# Patient Record
Sex: Male | Born: 1950
Health system: Southern US, Community
[De-identification: ages and names within clinical notes are randomized; demographics above are authoritative.]

## PROBLEM LIST (undated history)

## (undated) DIAGNOSIS — I10 Essential (primary) hypertension: Secondary | ICD-10-CM

## (undated) DIAGNOSIS — K056 Periodontal disease, unspecified: Secondary | ICD-10-CM

## (undated) DIAGNOSIS — E119 Type 2 diabetes mellitus without complications: Secondary | ICD-10-CM

## (undated) DIAGNOSIS — E785 Hyperlipidemia, unspecified: Secondary | ICD-10-CM

## (undated) DIAGNOSIS — E669 Obesity, unspecified: Secondary | ICD-10-CM

## (undated) DIAGNOSIS — R9431 Abnormal electrocardiogram [ECG] [EKG]: Secondary | ICD-10-CM

## (undated) DIAGNOSIS — G4733 Obstructive sleep apnea (adult) (pediatric): Secondary | ICD-10-CM

## (undated) HISTORY — DX: Type 2 diabetes mellitus without complications: E11.9

## (undated) HISTORY — DX: Periodontal disease, unspecified: K05.6

## (undated) HISTORY — PX: WISDOM TOOTH EXTRACTION: SHX21

## (undated) HISTORY — DX: Hyperlipidemia, unspecified: E78.5

## (undated) HISTORY — DX: Obstructive sleep apnea (adult) (pediatric): G47.33

## (undated) HISTORY — DX: Essential (primary) hypertension: I10

## (undated) HISTORY — DX: Obesity, unspecified: E66.9

## (undated) HISTORY — DX: Abnormal electrocardiogram (ECG) (EKG): R94.31

---

## 2000-07-30 ENCOUNTER — Ambulatory Visit (HOSPITAL_COMMUNITY): Admission: RE | Admit: 2000-07-30 | Discharge: 2000-07-30 | Payer: Self-pay | Admitting: *Deleted

## 2000-07-30 LAB — HM COLONOSCOPY

## 2001-02-11 ENCOUNTER — Ambulatory Visit (HOSPITAL_BASED_OUTPATIENT_CLINIC_OR_DEPARTMENT_OTHER): Admission: RE | Admit: 2001-02-11 | Discharge: 2001-02-11 | Payer: Self-pay | Admitting: Internal Medicine

## 2001-04-03 ENCOUNTER — Ambulatory Visit (HOSPITAL_BASED_OUTPATIENT_CLINIC_OR_DEPARTMENT_OTHER): Admission: RE | Admit: 2001-04-03 | Discharge: 2001-04-03 | Payer: Self-pay | Admitting: Internal Medicine

## 2002-03-14 ENCOUNTER — Inpatient Hospital Stay (HOSPITAL_COMMUNITY): Admission: AD | Admit: 2002-03-14 | Discharge: 2002-03-21 | Payer: Self-pay | Admitting: Internal Medicine

## 2002-03-17 ENCOUNTER — Encounter (INDEPENDENT_AMBULATORY_CARE_PROVIDER_SITE_OTHER): Payer: Self-pay | Admitting: Cardiology

## 2002-03-27 ENCOUNTER — Encounter: Admission: RE | Admit: 2002-03-27 | Discharge: 2002-06-25 | Payer: Self-pay | Admitting: Internal Medicine

## 2004-09-17 ENCOUNTER — Encounter: Admission: RE | Admit: 2004-09-17 | Discharge: 2004-09-17 | Payer: Self-pay | Admitting: Internal Medicine

## 2006-02-23 ENCOUNTER — Ambulatory Visit: Payer: Self-pay | Admitting: Internal Medicine

## 2007-02-18 DIAGNOSIS — E119 Type 2 diabetes mellitus without complications: Secondary | ICD-10-CM | POA: Insufficient documentation

## 2007-02-18 DIAGNOSIS — G4733 Obstructive sleep apnea (adult) (pediatric): Secondary | ICD-10-CM

## 2007-02-18 DIAGNOSIS — J310 Chronic rhinitis: Secondary | ICD-10-CM

## 2007-02-21 ENCOUNTER — Ambulatory Visit: Payer: Self-pay | Admitting: Internal Medicine

## 2008-06-19 ENCOUNTER — Ambulatory Visit: Payer: Self-pay | Admitting: Internal Medicine

## 2009-06-19 ENCOUNTER — Ambulatory Visit: Payer: Self-pay | Admitting: Internal Medicine

## 2009-07-08 ENCOUNTER — Telehealth (INDEPENDENT_AMBULATORY_CARE_PROVIDER_SITE_OTHER): Payer: Self-pay | Admitting: *Deleted

## 2009-07-26 ENCOUNTER — Telehealth (INDEPENDENT_AMBULATORY_CARE_PROVIDER_SITE_OTHER): Payer: Self-pay | Admitting: *Deleted

## 2009-11-29 ENCOUNTER — Encounter: Admission: RE | Admit: 2009-11-29 | Discharge: 2009-11-29 | Payer: Self-pay | Admitting: Ophthalmology

## 2010-02-25 NOTE — Progress Notes (Signed)
  Phone Note Other Incoming   Request: Send information Summary of Call: Request for records received from [employer]. Request forwarded to [facility].

## 2010-02-25 NOTE — Assessment & Plan Note (Signed)
Summary: 12 months/apc   Primary Provider/Referring Provider:  Willey Blade  CC:  Follow up visit-sleep; doing great.[name]  History of Present Illness: 03/15/22.09- OSA Allergic rhinitis 1 year follow-up .  continues cpap 13 cwp [facility], full face mask, heated humidifier This unit is comfortable after refit last year. Feels rested. No complaints from wife. Discussed weight gain.  06/19/08- OSA, Allergic rhinitis Continues fully compliant with cpap all night every night and reports doing well. Wife not reporting snoring through machine and he feels well rested. Full face mask.and humidifer. Allergy- not too bad this Spring  Jun 19, 2009- OSA, Allergic rhinitis Has never had pneumovax- he questioned. Not at risk so he will wait til age 11. OSA- Remains compliant every night with CPAP at 13. Denies snore through or fatigue. Allergy - No significant issue this year. He didn't need antihistamine during peak Spring pollen..     Current Medications (verified): 1)  Bayer Aspirin Ec Low Dose 81 Mg  Tbec (Aspirin) .... Take 1 Tablet By Mouth Once A Day 2)  Centrum Silver   Tabs (Multiple Vitamins-Minerals) .... Take 1 Tablet By Mouth Once A Day 3)  Juice Plus Fibre   Liqd (Nutritional Supplements) .... Four Times A Day 4)  Amaryl 2 Mg  Tabs (Glimepiride) .... Take 1 By Mouth Once Daily 5)  Avandamet 2-500 Mg  Tabs (Rosiglitazone-Metformin) .... Take 2 By Mouth Once Daily 6)  Lisinopril 20 Mg Tabs (Lisinopril) .... Take 1.5 By Mouth Once Daily 7)  Simvastatin 40 Mg  Tabs (Simvastatin) .... Take 1 By Mouth Once Daily 8)  Diltiazem Hcl Cr 240 Mg Xr24h-Cap (Diltiazem Hcl) .... Take 1 Capsule Daily By Mouth 9)  Lantus 100 Unit/ml  Soln (Insulin Glargine) .... 30 Units At Bedtime 10)  Humalog 100 Unit/ml  Soln (Insulin Lispro (Human)) .... Sliding Scale 11)  Hydrochlorothiazide 25 Mg Tabs (Hydrochlorothiazide) .... Take 1/2 Tablet By Mouth Daily 12)  Cpap 13  [facility]  Allergies (verified): No Known Drug Allergies  Past History:  Past Surgical History: Last updated: 06/19/2008 wisdom teeth  Family History: Last updated: 2007-03-16 Parents both died MI Diabetes  Social History: Last updated: 06/19/2008 Patient states former smoker.  Retired IT trainer at [employer]  Risk Factors: Smoking Status: quit (03-16-2007)  Past Medical History: Diabetes, Type 2 Sleep Apnea Allergic rhinitis  Review of Systems      See HPI  The patient denies shortness of breath with activity, shortness of breath at rest, productive cough, non-productive cough, coughing up blood, chest pain, irregular heartbeats, acid heartburn, indigestion, loss of appetite, weight change, abdominal pain, difficulty swallowing, sore throat, tooth/dental problems, headaches, nasal congestion/difficulty breathing through nose, and sneezing.    Vital Signs:  Patient profile:   60 year old male Height:      66 inches Weight:      208 pounds BMI:     33.69 O2 Sat:      98 % on Room air Pulse rate:   69 / minute BP sitting:   130 / 84  (left arm) Cuff size:   large  Vitals Entered By: Reynaldo Minium CMA (Jun 19, 2009 10:21 AM)  O2 Flow:  Room air  Physical Exam  Additional Exam:  General: A/Ox3; pleasant and cooperative, NAD, SKIN: no rash, lesions NODES: no lymphadenopathy HEENT: [place]/AT, EOM- WNL, Conjuctivae- clear, PERRLA, TM-WNL, Nose- turbinate edmea, able to breath through nose, Throat- clear and wnl, Mallampti IV NECK: Supple w/ fair ROM, JVD-  none, normal carotid impulses w/o bruits Thyroid-  CHEST: Clear to P&A HEART: RRR, no m/g/r heard ABDOMEN: moderately heavy ZOX:WRUE, nl pulses, no edema  NEURO: Grossly intact to observation      Impression & Recommendations:  Problem # 1:  OBSTRUCTIVE SLEEP APNEA (ICD-327.23)  Good compliance and control with CPAP. Discussed in context of his diabetes an other cardiac risk factors.  Problem # 2:   RHINITIS (ICD-472.0) Assessment: Improved  Insignificant issue this year  Medications Added to Medication List This Visit: 1)  Juice Plus Fibre Liqd (Nutritional supplements) .... Four times a day 2)  Lisinopril 20 Mg Tabs (Lisinopril) .... Take 1.5 by mouth once daily  Other Orders: Est. Patient Level III (45409)  Patient Instructions: 1)  Please schedule a follow-up appointment in 1 year. 2)  Continue CPAP at 13 3)  Copy sent to: Dr August Saucer

## 2010-02-25 NOTE — Progress Notes (Signed)
  Phone Note Other Incoming   Request: Send information Summary of Call: Request for records received from [employer]. Request forwarded to [facility].     

## 2010-02-25 NOTE — Progress Notes (Signed)
  Phone Note Other Incoming   Request: Send information Summary of Call: Request for records received from [employer]. Request forwarded to [facility].     

## 2010-06-13 ENCOUNTER — Encounter: Payer: Self-pay | Admitting: Internal Medicine

## 2010-06-13 NOTE — Discharge Summary (Signed)
NAMEBAYRON, DALTO NO.:  [id number]   MEDICAL RECORD NO.:  [id number]                   PATIENT TYPE:  INP   LOCATION:  0359                                 FACILITY:  [facility]   PHYSICIAN:  Eric L. August Saucer, M.D.                  DATE OF BIRTH:  Aug 28, 1950   DATE OF ADMISSION:  03/14/2002  DATE OF DISCHARGE:  03/21/2002                                 DISCHARGE SUMMARY   FINAL DIAGNOSES:  1. Diabetes mellitus with hyperosmolarity, type 2, non-insulin-dependent,     uncontrolled, 250.22.  2. Hypovolemia, severe, 276.5.  3. Rhabdomyolysis, 728.88k.  4. Thrombocytopenia, 287.5.  5. Hypertension, 401.9.  6. Renal ureteral disease, 593.9.   OPERATIONS AND PROCEDURES:  None.   HISTORY OF PRESENT ILLNESS:  This was the first recent [facility]  admissions for this 60 year old, married, black male, who was brought to the  office by his wife, complaining of severe weakness.  The wife reports the  patient had not been feeling well for approximately two weeks.  It was noted  that he had decreased appetite with progressive weakness.  He was also  having increased thirst as well as drinking plenty of liquids.  There had  been some nausea without vomiting.  The patient denied chest pain or  abdominal pain.  The patient, on the day of admission, became extremely weak  and was brought to the office for further evaluation.  He was noted to be  extremely dehydrated with hypotension.  The patient was subsequently  referred to [facility] for admission.  His blood sugar was checked  in the office and was noted to be high by Glucometer reading.   PAST MEDICAL HISTORY AND PHYSICAL EXAMINATION:  Per admission H&P.   HOSPITAL COURSE:  The patient was admitted to the intensive care unit for  evaluation of uncontrolled diabetes mellitus with a hyperosmolar state.  Notably, at the time of presentation, he had a glucose of 1455.  Sodium was  147,  potassium 6.2, chloride 98, BUN elevated at 92, creatinine 5.4.  He was  started on IV fluids with normal saline for rapid rehydration.  He was  started on sliding-scale insulin regimen as well.  He had some electrolyte  abnormalities including potassium being elevated as well as BUN and  creatinine.  This was monitored closely over the subsequent 24 hours.  Notably, he had trace ketones at most.  His picture was most consistent with  a hyperosmolar state.   The patient, over the initial 24 hours, had a gradual but steady drop in his  blood sugars.  As this did improve, his mental status cleared considerably  as well.  With repeated hydration, his renal function improved as well,  dropping to a creatinine of 1.4 and BUN of 23 by 03/16/02.  Notably, he was  found to have elevated CPK's  with relatively small MB and negative troponin.  This was consistent with a rhabdomyolysis picture which most likely  aggravated his kidney function as well.  The patient did experience  intermittent electrolyte abnormalities which required adjustments on an  ongoing basis.  Despite this, however, with repeated hydration and  stabilization, he made considerable progress.   As the patient became more lucid, he was seen by the diabetic coordinator  regarding of diabetes and medication change as well as the aspect of him  checking his own sugars.  The patient embraced this idea more consistently  as he became more aware.   With the rhabdomyolysis, the patient was noted to significant lower  extremity weakness.  He gradually was more ambulatory as he improved.  He  did have a lot of lower extremity edema secondary to lower and chronic  pressures.  It was felt that further improvement would be made as an  outpatient.  By 03/21/02, the patient was feeling considerably better.  He  was still notably weak with ambulation.  No orthostasis could be  demonstrated.  Blood sugars were 150 at most.  Notably, his  cholesterol was  noted to be elevated at 305.  His hemoglobin A1c was also elevated at 16.2.  These will be reviewed at a later date as he stabilizes.   The patient subsequently discharged home much improved.   MEDICATIONS AT TIME OF DISCHARGE:  1. Tiazac 120 mg p.o. daily.  2. Avandamet 2/500, 1 b.i.d.  3. Amaryl 2 mg q.a.m.  4. Lantus 28 units subcu q.h.s.  5. Aspirin 81 mg daily.  6. Multivitamins 1 daily.  7. Sliding-scale with Humalog before meals only.  For CBG's greater than     350, 18 units; 301-350, 15 units; 251-300, 12 units; 201-250, 9 units;     151-200, 6 units; 101-150, 3 units.   ACTIVITY:  As tolerated with progression of physical activity over the next  few weeks.   DIET:  He will be maintained on 4 g sodium 1800 calorie ADA diet.   FOLLOW UP:  He will have a [facility]-up [facility].                                               Eric L. August Saucer, M.D.    ELD/MEDQ  D:  04/26/2002  T:  04/26/2002  Job:  [id number]

## 2010-06-13 NOTE — H&P (Signed)
[name], [name] NO.:  [id number]   MEDICAL RECORD NO.:  [id number]                   PATIENT TYPE:  INP   LOCATION:  0164                                 FACILITY:  [facility]   PHYSICIAN:  [name] [name], M.D.                  DATE OF BIRTH:  1950-02-06   DATE OF ADMISSION:  03/14/2002  DATE OF DISCHARGE:                                HISTORY & PHYSICAL   CHIEF COMPLAINT:  Progressive weakness, dehydration, uncontrolled diabetes  mellitus.   HISTORY OF PRESENT ILLNESS:  The first recent [facility] admission  for this 60 year old married black male who was brought to the office by his  wife complaining of severe weakness. The wife reports the patient had been  feeling well for approximately two weeks. It was noted that he had decreased  appetite with progressive weakness. He also is having increasing thirst as  well as drinking plenty of liquids. There had been some nausea without  vomiting. He denied chest or abdominal pain. The patient today became  extremely weak and he was brought to the office for further evaluation. He  was noted to be extremely dehydrated with blood pressure of 80/30. The  patient subsequently referred to the hospital for admission. Note his blood  sugar was checked and it was found to be high by Glucometer reading.   History significant for him having previously diet controlled diabetes  mellitus. When last seen on January 22, he was doing well. The patient  notably did not obtain his CMET and A1C at that time. He denied any other  dietary discretions. He recently had a viral illness approximately six weeks  ago which he recovered.   The patient does not smoke or drink.   SOCIAL HISTORY:  The patient is married, three children. He is an  Airline pilot.   CURRENT MEDICATIONS:  1. Tiazac 120 mg p.o. q.d.  2. Aspirin 81 mg q.d.  3. Nasacort AQ 1-2 puffs q.d. p.r.n.  4. Juice plus nutritional supplement p.r.n.   PAST SURGICAL HISTORY:  No previous surgeries.   REVIEW OF SYMPTOMS:  Otherwise notable for him having sleep apnea. He does  use a CPAP machine as well.   PHYSICAL EXAMINATION:  GENERAL:  He is a weak appearing black male  sluggishly responsive.  VITAL SIGNS:  Height 5 feet 8 inches, weight 152 pounds (usual weight 194  pounds). Blood pressure initially 80/30 in the office, on repeat 109/54.  Temperature 98.3, heart rate 106. O2 sat was 92% on room air.  HEENT:  Head normocephalic, atraumatic without bruits. Extraocular muscles  are intact. There is no sinus tenderness. TM's with decreased light reflex.  Throat membranes extremely dry.  NECK:  No enlarged thyroid, no posterior cervical nodes.  LUNGS:  Clear without wheezes or rales. No E to A changes.  CARDIOVASCULAR:  Rapid rhythm, normal S1,  S2, no S3.  ABDOMEN:  Bowel sounds present, no tenderness appreciated.  EXTREMITIES:  Negative Homans, no edema. Markedly decreased skin turgor  noted.  NEUROLOGIC:  The patient is lethargic, arousable with stimulation. Slow to  answer questions. Would follow basic commands.   LABORATORY DATA:  CBC revealed WBC of 12,600, hemoglobin was 16.1,  hematocrit of 51.5, 328,000 platelets. Chemistry, sodium 147, potassium 6.2,  chloride 98 to 220, BUN 92, creatinine of 5.4, glucose of 1455. Liver  functions, SGOT 15, SGPT 19, alkaline phosphatase 94, total bilirubin 1.6.  Calcium 8.5. Urinalysis negative protein, specific gravity of 1.030, trace  ketones at  most. Small urine hemoglobin.   EKG sinus rhythm with a rate of 108. Normal axis. No acute changes  appreciated.   IMPRESSION:  1. Uncontrolled diabetes mellitus with decreased mental status secondary to     hyperosmolar state.  2. Severe dehydration.  3. Weight loss secondary to uncontrolled diabetes mellitus.  4. Rule out occult infection.  5. Renal insufficiency rule out secondary prerenal component due to     dehydration.  6. Sleep apnea  controlled by CPAP.  7. Recent respiratory infection.   PLAN:  The patient has been admitted to the intensive care unit. Will be  placed on IV fluids with initial rapid rehydration with normal saline. Will  place on glucomander for slow decrease of his blood sugars to avoid cerebral  edema. Will continue monitoring his electrolyte picture closely over the  next several hours with adjustments of IV fluids as necessary. Will plan on  CT scan of the head in a.m. for reevaluation of mental status. Further  therapy pending response to the above.                                               [name] [name], M.D.    ELD/MEDQ  D:  03/14/2002  T:  03/15/2002  Job:  [id number]

## 2010-06-13 NOTE — Assessment & Plan Note (Signed)
[name] [name]                             PULMONARY OFFICE NOTE   NAME:[name] [name]                        MRN:          [id number]  DATE:02/23/2006                            DOB:          Jun 22, 1950    PULMONARY/SLEEP MEDICINE FOLLOWUP   PROBLEM:  1. Obstructive sleep apnea.  2. Rhinitis.  3. Diabetes.   HISTORY:  This is a 60 year old gentleman last seen at [facility] in 2004 for follow up of sleep apnea.  He is an insulin  dependant diabetic, also with history of allergic rhinitis.  Never  smoked.  He comes now to establish for continuity, admitting that his  CPAP machine broke 6 months ago.  It took pressure from Dr. August [name] and  complaints from his wife that he was snoring loudly to motivate him to  come in now.  He has recognized increased tiredness.  His CPAP had been  set at 13, which seemed to be a comfortable pressure.   MEDICATIONS:  1. Amaryl.  2. Avandamet.  3. Lisinopril.  4. Baby aspirin.   ALLERGIES:  No medication allergy.   OBJECTIVE:  Weight 208 pounds, BP 124/78, pulse regular 76, room air  saturation 97%.  He is obese, pleasant, apparently comfortable and quite alert.  His nasal airway is not congested, palate length is long with spacing  4/4, small mandible.  No thyromegaly, strider or neck vein distension.  Voice quality is normal.  CHEST:  Clear to P and A.  Heart sounds regular without murmur or gallop.  No peripheral edema, no tremor.   IMPRESSION:  Severe obstructive sleep apnea (56 per hour on February 11, 2001).   PLAN:  We are getting replacement CPAP to hold pressure at 13 CWP.  I  have discussed his responsibility to drive safely, to lose weight and to  maintain good sleep hygiene.  As long as he is comfortable, we will  schedule return in 1 year, but earlier p.r.n.     [name] D. Maple Hudson, MD, [name] [name], FACP  Electronically Signed    CDY/MedQ  DD: 02/23/2006  DT: 02/24/2006  Job #:  [id number]   cc:   [name] [name], M.D.

## 2010-06-20 ENCOUNTER — Ambulatory Visit: Payer: Self-pay | Admitting: Internal Medicine

## 2010-07-09 ENCOUNTER — Ambulatory Visit (INDEPENDENT_AMBULATORY_CARE_PROVIDER_SITE_OTHER): Payer: BC Managed Care – PPO | Admitting: Internal Medicine

## 2010-07-09 ENCOUNTER — Encounter: Payer: Self-pay | Admitting: Internal Medicine

## 2010-07-09 VITALS — BP 132/68 | HR 64 | Ht 66.0 in | Wt 200.0 lb

## 2010-07-09 DIAGNOSIS — G4733 Obstructive sleep apnea (adult) (pediatric): Secondary | ICD-10-CM

## 2010-07-09 NOTE — Patient Instructions (Signed)
Order- [facility]- [facility]- replacement CPAP machine, 13 cwp, heated humidifier   Dx OSA

## 2010-07-09 NOTE — Assessment & Plan Note (Addendum)
Great compliance and control until something changed recently. His cpap machine is 58 years old and the likely source of the problem.  We will get that updated, then consider an autrotitration check if need be.  Weight loss would help.

## 2010-07-09 NOTE — Progress Notes (Signed)
  Subjective:    Patient ID: [name] [name], male    DOB: 07-11-1950, 60 y.o.   MRN: [id number]  HPI 07/09/10- 89 yoM former smoker followed for OSA, rhinitis, complicated by DM Last here Jun 19, 2009- In the last month his old CPAP has felt different, less comfortable. He doesn't think he has changed physically and says it wasn't a bad spring pollen season for him. Weight has not changed significantly.   Review of Systems Constitutional:   No weight loss, night sweats,  Fevers, chills, fatigue, lassitude. HEENT:   No headaches,  Difficulty swallowing,  Tooth/dental problems,  Sore throat,                No sneezing, itching, ear ache, nasal congestion, post nasal drip,   CV:  No chest pain,  Orthopnea, PND, swelling in lower extremities, anasarca, dizziness, palpitations  GI  No heartburn, indigestion, abdominal pain, nausea, vomiting, diarrhea, change in bowel habits, loss of appetite  Resp: No shortness of breath with exertion or at rest.  No excess mucus, no productive cough,  No non-productive cough,  No coughing up of blood.  No change in color of mucus.  No wheezing.   Skin: no rash or lesions.  GU: no dysuria, change in color of urine, no urgency or frequency.  No flank pain.  MS:  No joint pain or swelling.  No decreased range of motion.  No back pain.  Psych:  No change in mood or affect. No depression or anxiety.  No memory loss.      Objective:   Physical Exam General- Alert, Oriented, Affect-appropriate, Distress- none acute  obese  Skin- rash-none, lesions- none, excoriation- none  Lymphadenopathy- none  Head- atraumatic  Eyes- Gross vision intact, PERRLA, conjunctivae clear secretions  Ears- Hearing, canals, Tm - normal  Nose- Clear, No- Septal dev, mucus, polyps, erosion, perforation   Throat- Mallampati IV- large tongue , mucosa clear , drainage- none, tonsils- atrophic  Neck- flexible , trachea midline, no stridor , thyroid nl, carotid no bruit  Chest -  symmetrical excursion , unlabored     Heart/CV- RRR , no murmur , no gallop  , no rub, nl s1 s2                     - JVD- none , edema- none, stasis changes- none, varices- none     Lung- clear to P&A, wheeze- none, cough- none , dullness-none, rub- none     Chest wall-   Abd- tender-no, distended-no, bowel sounds-present, HSM- no  Br/ Gen/ Rectal- Not done, not indicated  Extrem- cyanosis- none, clubbing, none, atrophy- none, strength- nl  Neuro- grossly intact to observation         Assessment & Plan:

## 2010-07-13 ENCOUNTER — Encounter: Payer: Self-pay | Admitting: Internal Medicine

## 2010-08-08 ENCOUNTER — Ambulatory Visit: Payer: Self-pay | Admitting: Internal Medicine

## 2011-07-10 ENCOUNTER — Ambulatory Visit: Payer: BC Managed Care – PPO | Admitting: Internal Medicine

## 2012-03-30 DIAGNOSIS — K056 Periodontal disease, unspecified: Secondary | ICD-10-CM | POA: Insufficient documentation

## 2012-03-30 DIAGNOSIS — E785 Hyperlipidemia, unspecified: Secondary | ICD-10-CM | POA: Insufficient documentation

## 2012-03-30 DIAGNOSIS — I10 Essential (primary) hypertension: Secondary | ICD-10-CM | POA: Insufficient documentation

## 2012-12-05 ENCOUNTER — Encounter: Payer: Self-pay | Admitting: Interventional Cardiology

## 2013-03-01 ENCOUNTER — Ambulatory Visit: Payer: BC Managed Care – PPO | Admitting: Interventional Cardiology

## 2013-03-11 ENCOUNTER — Encounter: Payer: Self-pay | Admitting: *Deleted

## 2013-04-18 ENCOUNTER — Encounter: Payer: Self-pay | Admitting: Interventional Cardiology

## 2013-04-18 ENCOUNTER — Ambulatory Visit (INDEPENDENT_AMBULATORY_CARE_PROVIDER_SITE_OTHER): Payer: BC Managed Care – PPO | Admitting: Interventional Cardiology

## 2013-04-18 VITALS — BP 142/68 | HR 55 | Ht 66.0 in | Wt 200.0 lb

## 2013-04-18 DIAGNOSIS — E669 Obesity, unspecified: Secondary | ICD-10-CM

## 2013-04-18 DIAGNOSIS — R9431 Abnormal electrocardiogram [ECG] [EKG]: Secondary | ICD-10-CM | POA: Insufficient documentation

## 2013-04-18 DIAGNOSIS — E119 Type 2 diabetes mellitus without complications: Secondary | ICD-10-CM

## 2013-04-18 DIAGNOSIS — G4730 Sleep apnea, unspecified: Secondary | ICD-10-CM | POA: Insufficient documentation

## 2013-04-18 DIAGNOSIS — E785 Hyperlipidemia, unspecified: Secondary | ICD-10-CM

## 2013-04-18 NOTE — Progress Notes (Signed)
Patient ID: [name] ComaJames E Armenti Jr., male   DOB: October 20, 1950, 63 y.o.   MRN: [id number]    [address] [place], [place]  [id number] Phone: [phone] Fax:  [phone]  Date:  04/18/2013   ID:  [name] ComaJames E Golob Jr., DOB October 20, 1950, MRN [id number]  PCP:  Willey BladeEAN, ERIC, MD   ASSESSMENT:  1. Abnormal EKG with inferolateral T-wave abnormality and poor R-wave progression. Chronic overuse. Negative exercise treadmill and myocardial perfusion studies in the past. Most recent treadmill 2014 2. Obesity 3. Sedentary lifestyle 4. Hypertension   PLAN:  1. Weight loss 2. Aerobic exercise 3. Notify if any chest discomfort 4. Tight diabetes control 5. LDL cholesterol goal 70   SUBJECTIVE: [name] ComaJames E Kowal Jr. is a 63 y.o. male is having no cardiovascular complaints. He specifically denies chest pain, exertional dyspnea, orthopnea, claudication, and edema. He does have continuous lower extremity foot pain and coldness. He has not had palpitations or syncope. He denies medication side effects.   Wt Readings from Last 3 Encounters:  04/18/13 200 lb (90.719 kg)  03/11/13 198 lb (89.812 kg)  03/04/12 194 lb (87.998 kg)     Past Medical History  Diagnosis Date  . Diabetes mellitus, type 2   . OSA (obstructive sleep apnea)   . Allergic rhinitis   . Hypertension   . Hyperlipidemia     stable  . Obesity, mild     with 6 lbs weight loss  . Periodontal disease   . Abnormal EKG     Continue on Aspirin 81mg , Diagnostic Imaging:EC Stress test midmark (ordered for 03-02-2012)    Current Outpatient Prescriptions  Medication Sig Dispense Refill  . aspirin 81 MG tablet Take 81 mg by mouth daily.        [name]. diltiazem (DILACOR XR) 240 MG 24 hr capsule Take 240 mg by mouth daily.        [name]. glimepiride (AMARYL) 2 MG tablet Take 2 mg by mouth daily.       . hydrochlorothiazide 25 MG tablet Take 12.5 mg by mouth daily.        . insulin glargine (LANTUS) 100 UNIT/ML injection Inject 30 Units into the  skin at bedtime.        . insulin lispro (HUMALOG) 100 UNIT/ML injection Inject into the skin. Sliding scale depending on blood sugar levels      . lisinopril (PRINIVIL,ZESTRIL) 20 MG tablet Take 1 1/2 by mouth once daily      . Multiple Vitamin (MULTIVITAMIN) tablet Take 1 tablet by mouth daily.        . Nutritional Supplements (JUICE PLUS FIBRE) LIQD Take by mouth 4 (four) times daily.        . Saxagliptin-Metformin (KOMBIGLYZE XR) 05-998 MG TB24 Take 1 tablet by mouth daily.        . simvastatin (ZOCOR) 40 MG tablet Take 40 mg by mouth at bedtime.         No current facility-administered medications for this visit.    Allergies:   No Known Allergies  Social History:  The patient  reports that he quit smoking about 30 years ago. His smoking use included Cigarettes. He has a 3.4 pack-year smoking history. He has never used smokeless tobacco.   ROS:  Please see the history of present illness.   Appetite is stable. No transient neurological symptoms. No medication side effects.   All other systems reviewed and negative.   OBJECTIVE: VS:  BP 142/68  Pulse  55  Ht 5\' 6"  (1.676 m)  Wt 200 lb (90.719 kg)  BMI 32.30 kg/m2 Well nourished, well developed, in no acute distress, obese  HEENT: normal Neck: JVD flat. Carotid bruit absent  Cardiac:  normal S1, S2; RRR; no murmur Lungs:  clear to auscultation bilaterally, no wheezing, rhonchi or rales Abd: soft, nontender, no hepatomegaly Ext: Edema trace. Pulses 2+ bilateral  Skin: warm and dry Neuro:  CNs 2-12 intact, no focal abnormalities noted  EKG:  Normal sinus rhythm with poor R. wave progression and inferolateral T-wave abnormality unchanged from prior tracings and unchanged from EKG in August 2014 when exercise treadmill testing was performed.       Signed, Darci Needle III, MD 04/18/2013 8:29 AM

## 2013-04-18 NOTE — Patient Instructions (Signed)
Your physician recommends that you continue on your current medications as directed. Please refer to the Current Medication list given to you today.  Your physician discussed the importance of regular exercise and recommended that you start or continue a regular exercise program for good health.   Your physician wants you to follow-up in: 1 year You will receive a reminder letter in the mail two months in advance. If you don't receive a letter, please call our office to schedule the follow-up appointment.  

## 2014-02-09 ENCOUNTER — Other Ambulatory Visit: Payer: Self-pay | Admitting: Internal Medicine

## 2014-02-09 DIAGNOSIS — R0989 Other specified symptoms and signs involving the circulatory and respiratory systems: Secondary | ICD-10-CM

## 2014-04-19 ENCOUNTER — Telehealth: Payer: Self-pay

## 2014-04-19 ENCOUNTER — Encounter: Payer: Self-pay | Admitting: Interventional Cardiology

## 2014-04-19 ENCOUNTER — Ambulatory Visit (INDEPENDENT_AMBULATORY_CARE_PROVIDER_SITE_OTHER): Payer: BC Managed Care – PPO | Admitting: Interventional Cardiology

## 2014-04-19 VITALS — BP 136/64 | HR 62 | Ht 66.0 in | Wt 199.4 lb

## 2014-04-19 DIAGNOSIS — R9431 Abnormal electrocardiogram [ECG] [EKG]: Secondary | ICD-10-CM

## 2014-04-19 DIAGNOSIS — E785 Hyperlipidemia, unspecified: Secondary | ICD-10-CM

## 2014-04-19 DIAGNOSIS — I10 Essential (primary) hypertension: Secondary | ICD-10-CM | POA: Diagnosis not present

## 2014-04-19 DIAGNOSIS — G4733 Obstructive sleep apnea (adult) (pediatric): Secondary | ICD-10-CM | POA: Diagnosis not present

## 2014-04-19 MED ORDER — SIMVASTATIN 40 MG PO TABS: 40.0000 mg | ORAL_TABLET | Freq: Every day | ORAL | Status: DC

## 2014-04-19 NOTE — Patient Instructions (Addendum)
Your physician recommends that you continue on your current medications as directed. Please refer to the Current Medication list given to you today.  Your physician discussed the importance of regular exercise and recommended that you start or continue a regular exercise program for good health.  Your physician wants you to follow-up in: 1 year with Dr.Smith You will receive a reminder letter in the mail two months in advance. If you don't receive a letter, please call our office to schedule the follow-up appointment.      Low-Sodium Eating Plan Sodium raises blood pressure and causes water to be held in the body. Getting less sodium from food will help lower your blood pressure, reduce any swelling, and protect your heart, liver, and kidneys. We get sodium by adding salt (sodium chloride) to food. Most of our sodium comes from canned, boxed, and frozen foods. Restaurant foods, fast foods, and pizza are also very high in sodium. Even if you take medicine to lower your blood pressure or to reduce fluid in your body, getting less sodium from your food is important. WHAT IS MY PLAN? Most people should limit their sodium intake to 2,300 mg a day. Your health care provider recommends that you limit your sodium intake to __________ a day.  WHAT DO I NEED TO KNOW ABOUT THIS EATING PLAN? For the low-sodium eating plan, you will follow these general guidelines:  Choose foods with a % Daily Value for sodium of less than 5% (as listed on the food label).   Use salt-free seasonings or herbs instead of table salt or sea salt.   Check with your health care provider or pharmacist before using salt substitutes.   Eat fresh foods.  Eat more vegetables and fruits.  Limit canned vegetables. If you do use them, rinse them well to decrease the sodium.   Limit cheese to 1 oz (28 g) per day.   Eat lower-sodium products, often labeled as "lower sodium" or "no salt added."  Avoid foods that contain  monosodium glutamate (MSG). MSG is sometimes added to [place] food and some canned foods.  Check food labels (Nutrition Facts labels) on foods to learn how much sodium is in one serving.  Eat more home-cooked food and less restaurant, buffet, and fast food.  When eating at a restaurant, ask that your food be prepared with less salt or none, if possible.  HOW DO I READ FOOD LABELS FOR SODIUM INFORMATION? The Nutrition Facts label lists the amount of sodium in one serving of the food. If you eat more than one serving, you must multiply the listed amount of sodium by the number of servings. Food labels may also identify foods as:  Sodium free--Less than 5 mg in a serving.  Very low sodium--35 mg or less in a serving.  Low sodium--140 mg or less in a serving.  Light in sodium--50% less sodium in a serving. For example, if a food that usually has 300 mg of sodium is changed to become light in sodium, it will have 150 mg of sodium.  Reduced sodium--25% less sodium in a serving. For example, if a food that usually has 400 mg of sodium is changed to reduced sodium, it will have 300 mg of sodium. WHAT FOODS CAN I EAT? Grains Low-sodium cereals, including oats, puffed wheat and rice, and shredded wheat cereals. Low-sodium crackers. Unsalted rice and pasta. Lower-sodium bread.  Vegetables Frozen or fresh vegetables. Low-sodium or reduced-sodium canned vegetables. Low-sodium or reduced-sodium tomato sauce and paste. Low-sodium  or reduced-sodium tomato and vegetable juices.  Fruits Fresh, frozen, and canned fruit. Fruit juice.  Meat and Other Protein Products Low-sodium canned tuna and salmon. Fresh or frozen meat, poultry, seafood, and fish. Lamb. Unsalted nuts. Dried beans, peas, and lentils without added salt. Unsalted canned beans. Homemade soups without salt. Eggs.  Dairy Milk. Soy milk. Ricotta cheese. Low-sodium or reduced-sodium cheeses. Yogurt.  Condiments Fresh and dried herbs  and spices. Salt-free seasonings. Onion and garlic powders. Low-sodium varieties of mustard and ketchup. Lemon juice.  Fats and Oils Reduced-sodium salad dressings. Unsalted butter.  Other Unsalted popcorn and pretzels.  The items listed above may not be a complete list of recommended foods or beverages. Contact your dietitian for more options. WHAT FOODS ARE NOT RECOMMENDED? Grains Instant hot cereals. Bread stuffing, pancake, and biscuit mixes. Croutons. Seasoned rice or pasta mixes. Noodle soup cups. Boxed or frozen macaroni and cheese. Self-rising flour. Regular salted crackers. Vegetables Regular canned vegetables. Regular canned tomato sauce and paste. Regular tomato and vegetable juices. Frozen vegetables in sauces. Salted french fries. Olives. Rosita FirePickles. Relishes. Sauerkraut. Salsa. Meat and Other Protein Products Salted, canned, smoked, spiced, or pickled meats, seafood, or fish. Bacon, ham, sausage, hot dogs, corned beef, chipped beef, and packaged luncheon meats. Salt pork. Jerky. Pickled herring. Anchovies, regular canned tuna, and sardines. Salted nuts. Dairy Processed cheese and cheese spreads. Cheese curds. Blue cheese and cottage cheese. Buttermilk.  Condiments Onion and garlic salt, seasoned salt, table salt, and sea salt. Canned and packaged gravies. Worcestershire sauce. Tartar sauce. Barbecue sauce. Teriyaki sauce. Soy sauce, including reduced sodium. Steak sauce. Fish sauce. Oyster sauce. Cocktail sauce. Horseradish. Regular ketchup and mustard. Meat flavorings and tenderizers. Bouillon cubes. Hot sauce. Tabasco sauce. Marinades. Taco seasonings. Relishes. Fats and Oils Regular salad dressings. Salted butter. Margarine. Ghee. Bacon fat.  Other Potato and tortilla chips. Corn chips and puffs. Salted popcorn and pretzels. Canned or dried soups. Pizza. Frozen entrees and pot pies.  The items listed above may not be a complete list of foods and beverages to avoid.  Contact your dietitian for more information. Document Released: 07/04/2001 Document Revised: 01/17/2013 Document Reviewed: 11/16/2012 [facility] Patient Information 2015 [place]. This information is not intended to replace advice given to you by your health care provider. Make sure you discuss any questions you have with your health care provider.

## 2014-04-19 NOTE — Telephone Encounter (Signed)
Spoke with Crystal @Dr .Dean rqst a copy of pt recent labs Hga1c, Bmet, Lipid to be faxed to our office attn: Dr.Smith

## 2014-04-19 NOTE — Progress Notes (Signed)
Cardiology Office Note   Date:  04/19/2014   ID:  [name] ComaJames E Amble Jr., DOB 1950-02-20, MRN [id number]  PCP:  Willey BladeEAN, ERIC, MD  Cardiologist:   Lesleigh NoeSMITH III,Mica Releford W, MD   No chief complaint on file.     History of Present Illness: [name] ComaJames E Levett Jr. is a 64 y.o. male who presents for follow-up of hypertension, hyperlipidemia, and high risk for vascular disease. He is a known diabetic. Relatively sedentary lifestyle but no cardiopulmonary complaints. He specifically denies angina, syncope, palpitations, and orthopnea. Wife states that he snores and occasionally stops breathing. He denies excessive daytime sleepiness.    Past Medical History  Diagnosis Date  . Diabetes mellitus, type 2   . OSA (obstructive sleep apnea)   . Allergic rhinitis   . Hypertension   . Hyperlipidemia     stable  . Obesity, mild     with 6 lbs weight loss  . Periodontal disease   . Abnormal EKG     Continue on Aspirin 81mg , Diagnostic Imaging:EC Stress test midmark (ordered for 03-02-2012)    Past Surgical History  Procedure Laterality Date  . Wisdom tooth extraction       Current Outpatient Prescriptions  Medication Sig Dispense Refill  . aspirin 81 MG tablet Take 81 mg by mouth daily.      [name]. diltiazem (DILACOR XR) 240 MG 24 hr capsule Take 240 mg by mouth daily.      [name]. glimepiride (AMARYL) 2 MG tablet Take 2 mg by mouth daily.     . hydrochlorothiazide 25 MG tablet Take 12.5 mg by mouth daily.      . insulin glargine (LANTUS) 100 UNIT/ML injection Inject 30 Units into the skin at bedtime.      . insulin lispro (HUMALOG) 100 UNIT/ML injection Inject into the skin. Sliding scale depending on blood sugar levels    . lisinopril (PRINIVIL,ZESTRIL) 20 MG tablet Take 1 1/2 by mouth once daily    . Multiple Vitamin (MULTIVITAMIN) tablet Take 1 tablet by mouth daily.      . Saxagliptin-Metformin (KOMBIGLYZE XR) 05-998 MG TB24 Take 1 tablet by mouth daily.      . simvastatin (ZOCOR) 40 MG tablet Take 40 mg by  mouth at bedtime.       No current facility-administered medications for this visit.    Allergies:   Review of patient's allergies indicates no known allergies.    Social History:  The patient  reports that he quit smoking about 31 years ago. His smoking use included Cigarettes. He has a 3.4 pack-year smoking history. He has never used smokeless tobacco.   Family History:  The patient's family history includes Diabetes in an other family member; Heart attack in his father and mother.    ROS:  Please see the history of present illness.   Otherwise, review of systems are positive for .   All other systems are reviewed and negative.    PHYSICAL EXAM: VS:  BP 136/64 mmHg  Pulse 62  Ht 5\' 6"  (1.676 m)  Wt 199 lb 6.4 oz (90.447 kg)  BMI 32.20 kg/m2 , BMI Body mass index is 32.2 kg/(m^2). GEN: Well nourished, well developed, in no acute distress HEENT: normal Neck: no JVD, carotid bruits, or masses Cardiac: RRR; no murmurs, rubs, or gallops,no edema  Respiratory:  clear to auscultation bilaterally, normal work of breathing GI: soft, nontender, nondistended, + BS MS: no deformity or atrophy Skin: warm and dry, no rash Neuro:  Strength and sensation are intact Psych: euthymic mood, full affect   EKG:  EKG is ordered today. The ekg ordered today demonstrates normal sinus rhythm with inferior and anterolateral T wave abnormality, stable and unchanged from all prior tracings dating back over 10 years.   Recent Labs: No results found for requested labs within last 365 days.    Lipid Panel No results found for: CHOL, TRIG, HDL, CHOLHDL, VLDL, LDLCALC, LDLDIRECT    Wt Readings from Last 3 Encounters:  04/19/14 199 lb 6.4 oz (90.447 kg)  04/18/13 200 lb (90.719 kg)  03/11/13 198 lb (89.812 kg)      Other studies Reviewed: Additional studies/ records that were reviewed today include: None available. Review of the above records demonstrates: We will obtain laboratory data from  Dr. Willey Blade.   ASSESSMENT AND PLAN:  Abnormal EKG: EKG appears ischemic but is unchanged over the past 10 years.  Hyperlipidemia: Current status unknown  Essential hypertension: Excellent results today  Obstructive sleep apnea: On C Pap  Type 2 diabetes, current control unknown, followed by primary care physician Dr. Willey Blade.     Current medicines are reviewed at length with the patient today.  The patient does not have concerns regarding medicines.  The following changes have been made:  no change  Labs/ tests ordered today include: We will obtain laboratory data obtained by Dr. August Saucer  No orders of the defined types were placed in this encounter.     Disposition:   FU with Mendel Ryder in 12 months   Signed, Lesleigh Noe, MD  04/19/2014 9:30 AM    [facility] [address] [place], [place], [place]  [address] Phone: [phone]; Fax: [phone]

## 2014-04-20 ENCOUNTER — Encounter: Payer: Self-pay | Admitting: Interventional Cardiology

## 2014-09-21 ENCOUNTER — Telehealth: Payer: Self-pay | Admitting: Interventional Cardiology

## 2014-09-21 NOTE — Telephone Encounter (Signed)
New message     Pt finished a [facility].  Talk to the nurse about the results

## 2014-09-21 NOTE — Telephone Encounter (Signed)
Pt called back to inform Dr Katrinka Blazing and Misty Stanley that he called [facility] and they informed the pt that they have there own set of doctors that will review his results and then release them to the pt to his mailing address in 21 days.  Pt states that they will not fax the work-up and results to our office, that the pt will receive them in the mail in 21 days and will have to hand deliver these results to Dr Katrinka Blazing for review.  Pt also stated that he would like for Dr Katrinka Blazing to order lab CRP for the pt to have done at our office, for [facility] staff advised him to do so.  Informed the pt that I will endorse this message to Dr Katrinka Blazing and Misty Stanley for further review and recommendation of requested lab, and follow-up with the pt thereafter.  Pt verbalized understanding and agrees with this plan.

## 2014-09-21 NOTE — Telephone Encounter (Signed)
Pt is calling to inform Dr Katrinka Blazing and Misty Stanley CMA that he recently had a bunch of test done by [facility] and would like for Dr Katrinka Blazing to review his results and advise on them if needed. Pt states he is going to have [facility] to fax all his test and results to our office for Dr Katrinka Blazing to review.  Informed the pt that he should have them fax this information to [phone] ATTN Dr Katrinka Blazing and Misty Stanley CMA.  Pt also states that [facility] advised him to contact Dr Katrinka Blazing to order a CRP lab.  Informed the pt that I will route this message to Dr Katrinka Blazing and Misty Stanley, to make them aware of information being sent, and requested lab.  Pt verbalized understanding and agrees with this plan.

## 2014-09-22 NOTE — Telephone Encounter (Signed)
There is no added value to CRP. He is already being managed as if the CRP were elevated with aspirin and statin therapy(Simva).

## 2014-09-25 NOTE — Telephone Encounter (Signed)
Pt aware of Dr.Smith's response. There is no added value to CRP. He is already being managed as if the CRP were elevated with aspirin and statin therapy Pt appreciative for the call back and verbalized understanding.

## 2015-06-03 ENCOUNTER — Other Ambulatory Visit: Payer: Self-pay | Admitting: Interventional Cardiology

## 2015-06-04 NOTE — Telephone Encounter (Signed)
Please advise on refill request as there is not a recent lipid panel in epic. Thanks, MI 

## 2015-06-05 ENCOUNTER — Other Ambulatory Visit: Payer: Self-pay | Admitting: Interventional Cardiology

## 2015-09-06 ENCOUNTER — Encounter: Payer: Self-pay | Admitting: Interventional Cardiology

## 2015-09-06 ENCOUNTER — Ambulatory Visit (INDEPENDENT_AMBULATORY_CARE_PROVIDER_SITE_OTHER): Payer: BC Managed Care – PPO | Admitting: Interventional Cardiology

## 2015-09-06 VITALS — BP 118/60 | HR 56 | Ht 66.0 in | Wt 201.8 lb

## 2015-09-06 DIAGNOSIS — I10 Essential (primary) hypertension: Secondary | ICD-10-CM | POA: Diagnosis not present

## 2015-09-06 DIAGNOSIS — E119 Type 2 diabetes mellitus without complications: Secondary | ICD-10-CM

## 2015-09-06 DIAGNOSIS — R9431 Abnormal electrocardiogram [ECG] [EKG]: Secondary | ICD-10-CM | POA: Diagnosis not present

## 2015-09-06 DIAGNOSIS — G4730 Sleep apnea, unspecified: Secondary | ICD-10-CM | POA: Diagnosis not present

## 2015-09-06 DIAGNOSIS — E785 Hyperlipidemia, unspecified: Secondary | ICD-10-CM

## 2015-09-06 NOTE — Progress Notes (Signed)
Cardiology Office Note    Date:  09/06/2015   ID:  [name] Coma., DOB 04-Oct-1950, MRN [id number]  PCP:  Gwenyth Bender, MD  Cardiologist: Lesleigh Noe, MD   Chief Complaint  Patient presents with  . Coronary Artery Disease  . Follow-up    Htn    History of Present Illness:  [name] Stangl. is a 65 y.o. male who presents for follow-up of baseline abnormal EKG, negative ischemic evaluations, hypertension, hyperlipidemia, type 2 diabetes, and obstructive sleep apnea.  [name] [name] is doing well. He has no cardiovascular complaints. He specifically denies exertional leg weakness and discomfort. He has not had transient neurological symptoms. He denies dyspnea chest pain. He is able to lie flat. He uses seat. There is no peripheral edema. No medication side effects. I asked him about vascular screening. He believes he has had proprietary vascular [facility] but doesn't have the data.  Past Medical History:  Diagnosis Date  . Abnormal EKG    Continue on Aspirin , Diagnostic Imaging:EC Stress test midmark (ordered for 03-02-2012)  . Allergic rhinitis   . Diabetes mellitus, type 2 (HCC)   . Hyperlipidemia    stable  . Hypertension   . Obesity, mild    with 6 lbs weight loss  . OSA (obstructive sleep apnea)   . Periodontal disease     Past Surgical History:  Procedure Laterality Date  . WISDOM TOOTH EXTRACTION      Current Medications: Outpatient Medications Prior to Visit  Medication Sig Dispense Refill  . aspirin 81 MG tablet Take 81 mg by mouth daily.      [name] diltiazem (DILACOR XR) 240 MG 24 hr capsule Take 240 mg by mouth daily.      [name] glimepiride (AMARYL) 2 MG tablet Take 2 mg by mouth daily.     . hydrochlorothiazide 25 MG tablet Take 12.5 mg by mouth daily.      . insulin lispro (HUMALOG) 100 UNIT/ML injection Inject into the skin. Sliding scale depending on blood sugar levels    . lisinopril (PRINIVIL,ZESTRIL) 20 MG tablet Take 1.5  tablets by mouth once daily    . Multiple Vitamin (MULTIVITAMIN) tablet Take 1 tablet by mouth daily.      . insulin glargine (LANTUS) 100 UNIT/ML injection Inject 30 Units into the skin at bedtime.      . Saxagliptin-Metformin (KOMBIGLYZE XR) 05-998 MG TB24 Take 1 tablet by mouth daily.      . simvastatin (ZOCOR) 40 MG tablet TAKE ONE TABLET AT BEDTIME. 90 tablet 0   No facility-administered medications prior to visit.      Allergies:   Review of patient's allergies indicates no known allergies.   Social History   Social History  . Marital status: Married    Spouse name: N/A  . Number of children: N/A  . Years of education: N/A   Occupational History  . Retired     IT trainer with [employer]   Social History Main Topics  . Smoking status: Former Smoker    Packs/day: 0.20    Years: 17.00    Types: Cigarettes    Quit date: 01/27/1983  . Smokeless tobacco: Never Used  . Alcohol use Yes     Comment: once a month  . Drug use: No  . Sexual activity: Yes   Other Topics Concern  . None   Social History Narrative  . None     Family History:  The patient's family history includes Heart attack in his father and mother.   ROS:   Please see the history of present illness.    Sinus congestion but otherwise no complaints.  All other systems reviewed and are negative.   PHYSICAL EXAM:   VS:  BP 118/60   Pulse (!) 56   Ht 5\' 6"  (1.676 m)   Wt 201 lb 12.8 oz (91.5 kg)   SpO2 92%   BMI 32.57 kg/m    GEN: Well nourished, well developed, in no acute distress  HEENT: normal  Neck: no JVD, carotid bruits, or masses Cardiac: RRR; no murmurs, rubs, or gallops,no edema  Respiratory:  clear to auscultation bilaterally, normal work of breathing GI: soft, nontender, nondistended, + BS MS: no deformity or atrophy  Skin: warm and dry, no rash Neuro:  Alert and Oriented x 3, Strength and sensation are intact Psych: euthymic mood, full affect  Wt Readings from Last 3  Encounters:  09/06/15 201 lb 12.8 oz (91.5 kg)  04/19/14 199 lb 6.4 oz (90.4 kg)  04/18/13 200 lb (90.7 kg)      Studies/Labs Reviewed:   EKG:  EKG  Abnormal tracing with inferolateral T-wave inversion. Call wave [name] V1 through V3. No change when compared to prior tracings.  Recent Labs: No results found for requested labs within last 8760 hours.   Lipid Panel No results found for: CHOL, TRIG, HDL, CHOLHDL, VLDL, LDLCALC, LDLDIRECT  Additional studies/ records that were reviewed today include:  Reviewed all data available on the electronic health chart. No recent cardiac cardiovascular imaging. Primary care, Dr. Willey BladeEric Dean ordered a vascular screen of the neck but this was never performed.    ASSESSMENT:    1. Abnormal EKG   2. Sleep apnea   3. Essential hypertension   4. Type 2 diabetes mellitus without complication, unspecified long term insulin use status (HCC)   5. Hyperlipidemia      PLAN:  In order of problems listed above:  1. Unchanged over time with evidence of anterolateral T wave inversion. [name] [name]. No prior history of infarct. Negative myocardial perfusion imaging in the past. No symptoms of angina. 2. Where C Pap. No complaints. 3. Excellent control with target of 130/90 mmHg or less. 4. Followed by primary care. Tells me his A1c was less than 7. I congratulated him and encouraged continued aggressive management. 5. Low saturated fat diet and statin therapy with LDL target of 70 discussed with the patient.  With multiple cardiac risk factors including obesity, male sex, age greater than 5360, diabetes, obstructive sleep apnea, and hyperlipidemia he is at high risk for cardiovascular morbidity. We will order a screening bilateral carotid Doppler and abdominal ultrasound to screen for aortic aneurysm  Medication Adjustments/Labs and Tests Ordered: Current medicines are reviewed at length with the patient today.  Concerns regarding medicines  are outlined above.  Medication changes, Labs and Tests ordered today are listed in the Patient Instructions below. Patient Instructions  Medication Instructions:  Your physician recommends that you continue on your current medications as directed. Please refer to the Current Medication list given to you today.   Labwork: None ordered  Testing/Procedures: Your physician has requested that you have a carotid duplex. This test is an ultrasound of the carotid arteries in your neck. It looks at blood flow through these arteries that supply the brain with blood. Allow one hour for this exam. There are no restrictions or special instructions.  Your physician has requested  that you have an abdominal aorta duplex. During this test, an ultrasound is used to evaluate the aorta. Allow 30 minutes for this exam. Do not eat after midnight the day before and avoid carbonated beverages   Follow-Up: Your physician wants you to follow-up in: 1 year with Dr.Chelsea Nusz You will receive a reminder letter in the mail two months in advance. If you don't receive a letter, please call our office to schedule the follow-up appointment.   Any Other Special Instructions Will Be Listed Below (If Applicable).     If you need a refill on your cardiac medications before your next appointment, please call your pharmacy.      Signed, Lesleigh Noe, MD  09/06/2015 11:09 AM    [facility] [address] [place], [place], [place]  [address] Phone: [phone]; Fax: [phone]

## 2015-09-06 NOTE — Patient Instructions (Signed)
Medication Instructions:  Your physician recommends that you continue on your current medications as directed. Please refer to the Current Medication list given to you today.   Labwork: None ordered  Testing/Procedures: Your physician has requested that you have a carotid duplex. This test is an ultrasound of the carotid arteries in your neck. It looks at blood flow through these arteries that supply the brain with blood. Allow one hour for this exam. There are no restrictions or special instructions.  Your physician has requested that you have an abdominal aorta duplex. During this test, an ultrasound is used to evaluate the aorta. Allow 30 minutes for this exam. Do not eat after midnight the day before and avoid carbonated beverages   Follow-Up: Your physician wants you to follow-up in: 1 year with Dr.Smith You will receive a reminder letter in the mail two months in advance. If you don't receive a letter, please call our office to schedule the follow-up appointment.   Any Other Special Instructions Will Be Listed Below (If Applicable).     If you need a refill on your cardiac medications before your next appointment, please call your pharmacy.

## 2015-10-06 ENCOUNTER — Other Ambulatory Visit: Payer: Self-pay | Admitting: Interventional Cardiology

## 2015-10-11 ENCOUNTER — Encounter (HOSPITAL_COMMUNITY): Payer: BC Managed Care – PPO

## 2015-10-11 ENCOUNTER — Inpatient Hospital Stay (HOSPITAL_COMMUNITY): Admission: RE | Admit: 2015-10-11 | Payer: BC Managed Care – PPO | Source: Ambulatory Visit

## 2015-11-11 ENCOUNTER — Other Ambulatory Visit: Payer: Self-pay | Admitting: Interventional Cardiology

## 2015-11-11 DIAGNOSIS — I10 Essential (primary) hypertension: Secondary | ICD-10-CM

## 2015-11-11 DIAGNOSIS — E119 Type 2 diabetes mellitus without complications: Secondary | ICD-10-CM

## 2015-11-11 DIAGNOSIS — Z136 Encounter for screening for cardiovascular disorders: Secondary | ICD-10-CM

## 2015-11-11 DIAGNOSIS — I6523 Occlusion and stenosis of bilateral carotid arteries: Secondary | ICD-10-CM

## 2015-11-15 ENCOUNTER — Other Ambulatory Visit (HOSPITAL_COMMUNITY): Payer: BC Managed Care – PPO

## 2015-11-15 ENCOUNTER — Encounter (HOSPITAL_COMMUNITY): Payer: BC Managed Care – PPO

## 2015-12-06 ENCOUNTER — Inpatient Hospital Stay (HOSPITAL_COMMUNITY): Admission: RE | Admit: 2015-12-06 | Payer: BC Managed Care – PPO | Source: Ambulatory Visit

## 2015-12-09 ENCOUNTER — Encounter (HOSPITAL_COMMUNITY): Payer: Self-pay | Admitting: Interventional Cardiology

## 2015-12-13 ENCOUNTER — Other Ambulatory Visit: Payer: Self-pay | Admitting: Interventional Cardiology

## 2015-12-13 NOTE — Telephone Encounter (Signed)
I do not see where Dr Katrinka BlazingSmith has ever refilled this for the patient. Please advise. Thanks, MI

## 2015-12-16 NOTE — Telephone Encounter (Signed)
Looks like this was last filled in 2012 by Pulmonary.  Not sure who has been filling it since.  Would defer to pulmonary or PCP.

## 2016-09-07 ENCOUNTER — Ambulatory Visit: Payer: BC Managed Care – PPO | Admitting: Interventional Cardiology

## 2016-10-30 ENCOUNTER — Encounter: Payer: Self-pay | Admitting: Interventional Cardiology

## 2016-11-06 ENCOUNTER — Ambulatory Visit: Payer: BC Managed Care – PPO | Admitting: Interventional Cardiology

## 2016-11-22 ENCOUNTER — Other Ambulatory Visit: Payer: Self-pay | Admitting: Interventional Cardiology

## 2016-11-23 NOTE — Telephone Encounter (Signed)
Pt has appt 12/18.  Ok to refill until then.   Thanks!

## 2016-11-23 NOTE — Telephone Encounter (Signed)
Please advise on refill request as patient does not have a lipid panel in epic. Thanks, MI 

## 2017-01-11 NOTE — Progress Notes (Signed)
Cardiology Office Note    Date:  01/12/2017   ID:  [name] ComaJames E Paluch Jr., DOB 04/28/1950, MRN [id number]  PCP:  Gwenyth Benderean, Eric L, MD  Cardiologist: Lesleigh NoeHenry W Phuong Hillary III, MD   Chief Complaint  Patient presents with  . Coronary Artery Disease    History of Present Illness:  [name] ComaJames E Miao Jr. is a 66 y.o. male who presents for follow-up of baseline abnormal EKG, negative ischemic evaluations, hypertension, hyperlipidemia, type 2 diabetes, and obstructive sleep apnea.   [name] is doing well.  He has no cardiac complaints.  He has had a complete exam within the past 3 months by Dr. Willey BladeEric Dean and was told his laboratory data is all stable.  He denies orthopnea, PND, and edema.   Past Medical History:  Diagnosis Date  . Abnormal EKG    Continue on Aspirin 81mg , Diagnostic Imaging:EC Stress test midmark (ordered for 03-02-2012)  . Allergic rhinitis   . Diabetes mellitus, type 2 (HCC)   . Hyperlipidemia    stable  . Hypertension   . Obesity, mild    with 6 lbs weight loss  . OSA (obstructive sleep apnea)   . Periodontal disease     Past Surgical History:  Procedure Laterality Date  . WISDOM TOOTH EXTRACTION      Current Medications: Outpatient Medications Prior to Visit  Medication Sig Dispense Refill  . aspirin 81 MG tablet Take 81 mg by mouth daily.      [name]. diltiazem (DILACOR XR) 240 MG 24 hr capsule Take 240 mg by mouth daily.      [name]. glimepiride (AMARYL) 2 MG tablet Take 2 mg by mouth daily.     . hydrochlorothiazide 25 MG tablet Take 12.5 mg by mouth daily.      . insulin aspart (NOVOLOG) 100 unit/mL injection Take as directed per sliding scale.    [name]. LEVEMIR 100 UNIT/ML injection Inject 32 Units into the skin at bedtime.  12  . lisinopril (PRINIVIL,ZESTRIL) 20 MG tablet Take 1.5 tablets by mouth once daily    . Multiple Vitamin (MULTIVITAMIN) tablet Take 1 tablet by mouth daily.      . simvastatin (ZOCOR) 40 MG tablet Take 1 tablet (40 mg total) by mouth at bedtime. Patient needs to  keep 01/12/17 appointment for further refills 30 tablet 1  . sitaGLIPtin-metformin (JANUMET) 50-1000 MG tablet Take 1 tablet by mouth daily with supper.    . Insulin Detemir (LEVEMIR FLEXPEN [place]) Inject 30 Units into the skin at bedtime.    . insulin lispro (HUMALOG) 100 UNIT/ML injection Inject into the skin. Sliding scale depending on blood sugar levels     No facility-administered medications prior to visit.      Allergies:   Patient has no known allergies.   Social History   Socioeconomic History  . Marital status: Married    Spouse name: None  . Number of children: None  . Years of education: None  . Highest education level: None  Social Needs  . Financial resource strain: None  . Food insecurity - worry: None  . Food insecurity - inability: None  . Transportation needs - medical: None  . Transportation needs - non-medical: None  Occupational History  . 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  Tobacco Use  . Smoking status: Former Smoker    Packs/day: 0.20    Years: 17.00    Pack years: 3.40    Types: Cigarettes    Last attempt to  quit: 01/27/1983    Years since quitting: 33.9  . Smokeless tobacco: Never Used  Substance and Sexual Activity  . Alcohol use: Yes    Comment: once a month  . Drug use: No  . Sexual activity: Yes  Other Topics Concern  . None  Social History Narrative  . None     Family History:  The patient's family history includes Diabetes in his unknown relative; Heart attack in his father and mother.   ROS:   Please see the history of present illness.    None.  He has an upcoming colonoscopy.  This is a screening procedure by Dr. Bosie Clos. All other systems reviewed and are negative.   PHYSICAL EXAM:   VS:  BP 126/72   Pulse 60   Ht 5\' 5"  (1.651 m)   Wt 191 lb 12.8 oz (87 kg)   BMI 31.92 kg/m    GEN: Well nourished, well developed, in no acute distress  HEENT: normal  Neck: no JVD, carotid bruits, or  masses Cardiac: RRR; no murmurs, rubs, or gallops,no edema  Respiratory:  clear to auscultation bilaterally, normal work of breathing GI: soft, nontender, nondistended, + BS MS: no deformity or atrophy  Skin: warm and dry, no rash Neuro:  Alert and Oriented x 3, Strength and sensation are intact Psych: euthymic mood, full affect  Wt Readings from Last 3 Encounters:  01/12/17 191 lb 12.8 oz (87 kg)  09/06/15 201 lb 12.8 oz (91.5 kg)  04/19/14 199 lb 6.4 oz (90.4 kg)      Studies/Labs Reviewed:   EKG:  EKG sinus rhythm, QS pattern with poor R wave progression, lateral precordial T wave abnormality involving the inferior leads.  When compared to the prior tracing from 2017, no significant change has occurred.  T wave abnormality is improved compared to prior.  Recent Labs: No results found for requested labs within last 8760 hours.   Lipid Panel No results found for: CHOL, TRIG, HDL, CHOLHDL, VLDL, LDLCALC, LDLDIRECT  Additional studies/ records that were reviewed today include:  No laboratory data to evaluate.    ASSESSMENT:    1. Essential hypertension   2. Obstructive sleep apnea   3. Other hyperlipidemia   4. Abnormal EKG   5. Obesity, mild      PLAN:  In order of problems listed above:  1. Very well controlled.  Target 130/85 mmHg or less. 2. Not actively treated 3. LDL target less than 100 and preferably less than 70. 4. EKG did not have as much repolarization abnormality on this occasion as previously. 5. Stable without significant 10 pound weight loss in the last year.  Clinical follow-up in 1 year.  Maintain active lifestyle.  Medication Adjustments/Labs and Tests Ordered: Current medicines are reviewed at length with the patient today.  Concerns regarding medicines are outlined above.  Medication changes, Labs and Tests ordered today are listed in the Patient Instructions below. Patient Instructions  Medication Instructions:  Your physician recommends  that you continue on your current medications as directed. Please refer to the Current Medication list given to you today.   Labwork: NONE ORDERED TODAY  Testing/Procedures: NONE ORDERED TODAY  Follow-Up: Your physician wants you to follow-up in: 1 YEAR WITH DR. Marlou Starks will receive a reminder letter in the mail two months in advance. If you don't receive a letter, please call our office to schedule the follow-up appointment.   Any Other Special Instructions Will Be Listed Below (If Applicable).  If you need a refill on your cardiac medications before your next appointment, please call your pharmacy.      Signed, Lesleigh NoeHenry W Akai Dollard III, MD  01/12/2017 4:56 PM    [facility] [address], [place], [place]  [address] Phone: [phone](336) [phone]; Fax: [phone]

## 2017-01-12 ENCOUNTER — Ambulatory Visit: Payer: BC Managed Care – PPO | Admitting: Interventional Cardiology

## 2017-01-12 ENCOUNTER — Encounter: Payer: Self-pay | Admitting: Interventional Cardiology

## 2017-01-12 VITALS — BP 126/72 | HR 60 | Ht 65.0 in | Wt 191.8 lb

## 2017-01-12 DIAGNOSIS — E7849 Other hyperlipidemia: Secondary | ICD-10-CM | POA: Diagnosis not present

## 2017-01-12 DIAGNOSIS — I10 Essential (primary) hypertension: Secondary | ICD-10-CM | POA: Diagnosis not present

## 2017-01-12 DIAGNOSIS — R9431 Abnormal electrocardiogram [ECG] [EKG]: Secondary | ICD-10-CM | POA: Diagnosis not present

## 2017-01-12 DIAGNOSIS — E669 Obesity, unspecified: Secondary | ICD-10-CM

## 2017-01-12 DIAGNOSIS — G4733 Obstructive sleep apnea (adult) (pediatric): Secondary | ICD-10-CM

## 2017-01-12 NOTE — Patient Instructions (Signed)
Medication Instructions:  Your physician recommends that you continue on your current medications as directed. Please refer to the Current Medication list given to you today.   Labwork: NONE ORDERED TODAY  Testing/Procedures: NONE ORDERED TODAY  Follow-Up: Your physician wants you to follow-up in: 1 YEAR WITH DR. SMITH You will receive a reminder letter in the mail two months in advance. If you don't receive a letter, please call our office to schedule the follow-up appointment.   Any Other Special Instructions Will Be Listed Below (If Applicable).     If you need a refill on your cardiac medications before your next appointment, please call your pharmacy.   

## 2017-02-05 ENCOUNTER — Other Ambulatory Visit: Payer: Self-pay | Admitting: Interventional Cardiology

## 2017-02-05 MED ORDER — SIMVASTATIN 40 MG PO TABS: 40.0000 mg | ORAL_TABLET | Freq: Every day | ORAL | 11 refills | Status: DC

## 2017-02-05 NOTE — Telephone Encounter (Signed)
New message     *STAT* If patient is at the pharmacy, call can be transferred to refill team.   1. Which medications need to be refilled? (please list name of each medication and dose if known) Simvastatin 40 mg  2. Which pharmacy/location (including street and city if local pharmacy) is medication to be sent to? [facility], [facility]  3. Do they need a 30 day or 90 day supply? 30 day

## 2017-02-05 NOTE — Telephone Encounter (Signed)
Pt's medication was sent to pt's pharmacy as requested. Confirmation received.  °

## 2018-02-03 ENCOUNTER — Other Ambulatory Visit: Payer: Self-pay

## 2018-02-03 NOTE — Patient Outreach (Signed)
  [facility]) Care Management Chronic Special Needs Program  02/03/2018  Name: Chike Mazzotti. DOB: [birth year]  MRN: [id number]  Mr. Mcarthur Bernasconi is enrolled in a chronic special needs plan for Diabetes. Client called with no answer No answer and HIPAA compliant message left. Plan for 2rd outreach call in one week Chronic care management coordinator will attempt outreach in one week if call not returned   Dudley Major RN, Maximiano Coss, CDE Chronic Care Management Coordinator [facility] Care Management [phone]

## 2018-02-10 ENCOUNTER — Other Ambulatory Visit: Payer: Self-pay

## 2018-02-10 NOTE — Progress Notes (Signed)
Cardiology Office Note:    Date:  02/11/2018   ID:  [name] ComaJames E Molla Jr., DOB 1950/11/18, MRN [id number]  PCP:  Gwenyth Benderean, Eric L, MD  Cardiologist:  No primary care provider on file.   Referring MD: Gwenyth Benderean, Eric L, MD   Chief Complaint  Patient presents with  . Hypertension  . Diabetes  . Advice Only    Risk modification  . Hyperlipidemia    History of Present Illness:    [name] ComaJames E Legate Jr. is a 68 y.o. male with a hx of baseline abnormal EKG, negative ischemic evaluations, hypertension, hyperlipidemia, type 2 diabetes, and obstructive sleep apnea.   [name] [name] has no cardiovascular complaints.  He specifically denies dyspnea, orthopnea, chest discomfort, exertional intolerance, claudication, palpitations, and syncope.  He is sedentary and is getting much less than 100 minutes of aerobic activity per week.  He believes his diabetes and lipids are under good control.  These are managed by primary care physician Dr. Willey BladeEric Dean.  He is concerned that his systolic pressures frequently run above 140 and at times are as high as 170 mmHg.  He is compliant with his medical regimen and CPAP for known sleep apnea.  He is concerned that no cardiac evaluation to assess his current status has been done quite some time.   Past Medical History:  Diagnosis Date  . Abnormal EKG    Continue on Aspirin 81mg , Diagnostic Imaging:EC Stress test midmark (ordered for 03-02-2012)  . Allergic rhinitis   . Diabetes mellitus, type 2 (HCC)   . Hyperlipidemia    stable  . Hypertension   . Obesity, mild    with 6 lbs weight loss  . OSA (obstructive sleep apnea)   . Periodontal disease     Past Surgical History:  Procedure Laterality Date  . WISDOM TOOTH EXTRACTION      Current Medications: Current Meds  Medication Sig  . aspirin 81 MG tablet Take 81 mg by mouth daily.    [name]. glimepiride (AMARYL) 2 MG tablet Take 2 mg by mouth daily.   . hydrochlorothiazide 25 MG tablet Take 12.5 mg by mouth daily.    .  insulin aspart (NOVOLOG) 100 unit/mL injection Take as directed per sliding scale.  [name]. LANTUS 100 UNIT/ML injection   . lisinopril (PRINIVIL,ZESTRIL) 20 MG tablet Take 1.5 tablets by mouth once daily  . Multiple Vitamin (MULTIVITAMIN) tablet Take 1 tablet by mouth daily.    . simvastatin (ZOCOR) 40 MG tablet Take 1 tablet (40 mg total) by mouth at bedtime.  . sitaGLIPtin-metformin (JANUMET) 50-1000 MG tablet Take 1 tablet by mouth daily with supper.  . [DISCONTINUED] diltiazem (DILACOR XR) 240 MG 24 hr capsule Take 240 mg by mouth daily.       Allergies:   Patient has no known allergies.   Social History   Socioeconomic History  . Marital status: Married    Spouse name: Not on file  . Number of children: Not on file  . Years of education: Not on file  . Highest education level: Not on file  Occupational History  . 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  Social Needs  . Financial resource strain: Not on file  . Food insecurity:    Worry: Not on file    Inability: Not on file  . Transportation needs:    Medical: Not on file    Non-medical: Not on file  Tobacco Use  . Smoking status: Former Smoker    Packs/day:  0.20    Years: 17.00    Pack years: 3.40    Types: Cigarettes    Last attempt to quit: 01/27/1983    Years since quitting: 35.0  . Smokeless tobacco: Never Used  Substance and Sexual Activity  . Alcohol use: Yes    Comment: once a month  . Drug use: No  . Sexual activity: Yes  Lifestyle  . Physical activity:    Days per week: Not on file    Minutes per session: Not on file  . Stress: Not on file  Relationships  . Social connections:    Talks on phone: Not on file    Gets together: Not on file    Attends religious service: Not on file    Active member of club or organization: Not on file    Attends meetings of clubs or organizations: Not on file    Relationship status: Not on file  Other Topics Concern  . Not on file  Social History  Narrative  . Not on file     Family History: The patient's family history includes Diabetes in an other family member; Heart attack in his father and mother.  ROS:   Please see the history of present illness.    Sedentary.  He is compliant with CPAP.  No peripheral edema.  No palpitations or chest pain with activity.  All other systems reviewed and are negative.  EKGs/Labs/Other Studies Reviewed:    The following studies were reviewed today:  2D Doppler echocardiogram 03/17/2002: SUMMARY - Overall left ventricular systolic function was normal. Left    ventricular ejection fraction was estimated , range being 55    % to 65 %. There were no left ventricular regional wall    motion abnormalities. There was mild asymmetric septal    hypertrophy. There was Doppler evidence for dynamic left    ventricular mid-cavity obstruction at rest, with a peak    velocity of 2.3 m/sec , and with a peak gradient of 21 mmHg. - Aortic valve thickness was mildly increased. - There was mild mitral annular calcification. There was mild    mitral valvular regurgitation.  EKG:  EKG normal sinus rhythm, QS pattern V1 and V2.  T wave abnormality V4 through V6 as well as inferior leads.  When compared to prior tracings, no change has occurred.  Recent Labs: No results found for requested labs within last 8760 hours.  Recent Lipid Panel No results found for: CHOL, TRIG, HDL, CHOLHDL, VLDL, LDLCALC, LDLDIRECT  Physical Exam:    VS:  BP (!) 146/64   Pulse (!) 57   Ht 5\' 5"  (1.651 m)   Wt 196 lb 12.8 oz (89.3 kg)   SpO2 97%   BMI 32.75 kg/m     Wt Readings from Last 3 Encounters:  02/11/18 196 lb 12.8 oz (89.3 kg)  01/12/17 191 lb 12.8 oz (87 kg)  09/06/15 201 lb 12.8 oz (91.5 kg)     GEN: Obese.. No acute distress HEENT: Normal NECK: No JVD. LYMPHATICS: No lymphadenopathy CARDIAC: RRR.  No murmur, no gallop, no edema VASCULAR: 2+ and symmetric carotid and  radial pulses, no carotid bruits RESPIRATORY:  Clear to auscultation without rales, wheezing or rhonchi  ABDOMEN: Soft, non-tender, non-distended, No pulsatile mass, MUSCULOSKELETAL: No deformity  SKIN: Warm and dry NEUROLOGIC:  Alert and oriented x 3 PSYCHIATRIC:  Normal affect   ASSESSMENT:    1. Abnormal EKG   2. Other hyperlipidemia   3. Obstructive sleep apnea  4. Essential hypertension   5. Hypertrophic cardiomyopathy (HCC)   6. Type 2 diabetes mellitus without complication, without long-term current use of insulin (HCC)    PLAN:    In order of problems listed above:  1. High risk with reference to possibility of cardiac events due to type 2 diabetes, hypertension, hyperlipidemia, obesity, and sleep apnea.  He needs a coronary calcium score to further risk stratify.  We will also perform an exercise treadmill test as a surveillance to rule out silent ischemia. 2. No recent lipid values are available.  These have been done with Dr. Willey Blade.  We will get copies which will allow me to better assess the patient's CV risk. 3. Compliant with CPAP.  Importance of treatment is stressed. 4. Systolic blood pressure is poorly controlled for a diabetic.  Target 130/80 mmHg or less.  Increase diltiazem to 360 mg/day.  Additional management strategy would be to increase benazepril component as well to 40 mg.  May need to add low-dose Aldactone. 5. We will follow-up on remote echo that suggested the possibility of asymmetric septal hypertrophy with mid cavity obliteration by repeating a 2D Doppler echocardiogram. 6. Consider adding an SGLT2 (dapagliflozin) to better control diabetes and decrease CV risk.  Overall risk is greater than 20% over 10 years.  Major risk factors include obesity, sedentary lifestyle, sleep apnea, poorly controlled hypertension, diabetes mellitus type 2, and hyperlipidemia.  If triglycerides elevated, he may benefit from icosapent ethyl in addition to statin  therapy.  Overall education and awareness concerning primary/secondary risk prevention was discussed in detail: LDL less than 70, hemoglobin A1c less than 7, blood pressure target less than 130/80 mmHg, >150 minutes of moderate aerobic activity per week, avoidance of smoking, weight control (via diet and exercise), and continued surveillance/management of/for obstructive sleep apnea.  We will perform an exercise treadmill test, 2D Doppler echocardiogram, coronary calcium score to have a better understanding of current cardiovascular risk which may help modify therapy.   Medication Adjustments/Labs and Tests Ordered: Current medicines are reviewed at length with the patient today.  Concerns regarding medicines are outlined above.  Orders Placed This Encounter  Procedures  . CT CARDIAC SCORING  . Exercise Tolerance Test  . EKG 12-Lead  . ECHOCARDIOGRAM COMPLETE   Meds ordered this encounter  Medications  . diltiazem (CARDIZEM CD) 360 MG 24 hr capsule    Sig: Take 1 capsule (360 mg total) by mouth daily.    Dispense:  90 capsule    Refill:  3    Dose change    Patient Instructions  Medication Instructions:  1) INCREASE Diltiazem to 360mg  once daily  If you need a refill on your cardiac medications before your next appointment, please call your pharmacy.   Lab work: None If you have labs (blood work) drawn today and your tests are completely normal, you will receive your results only by: [name] MyChart Message (if you have MyChart) OR . A paper copy in the mail If you have any lab test that is abnormal or we need to change your treatment, we will call you to review the results.  Testing/Procedures: Your physician has requested that you have an exercise tolerance test. For further information please visit [email or web address] Please also follow instruction sheet, as given.  Your physician recommends that you have a Coronary Calcium Score performed.  Follow-Up: At [facility], you  and your health needs are our priority.  As part of our continuing mission to provide you with exceptional  heart care, we have created designated Provider Care Teams.  These Care Teams include your primary Cardiologist (physician) and Advanced Practice Providers (APPs -  Physician Assistants and Nurse Practitioners) who all work together to provide you with the care you need, when you need it. You will need a follow up appointment in 12 months.  Please call our office 2 months in advance to schedule this appointment.  You may see Dr. Katrinka BlazingSmith or one of the following Advanced Practice Providers on your designated Care Team:   Norma FredricksonLori Gerhardt, NP Nada BoozerLaura Ingold, NP . Georgie ChardJill McDaniel, NP  Any Other Special Instructions Will Be Listed Below (If Applicable).       Signed, Lesleigh NoeHenry W Smith III, MD  02/11/2018 12:31 PM    [facility]

## 2018-02-10 NOTE — Patient Outreach (Signed)
  [facility]) Care Management Chronic Special Needs Program  02/10/2018  Name: [name] [name]. DOB: December 26, 1950  MRN: [id number]  [name] is enrolled in a chronic special needs plan for Diabetes. Client called with no answer No answer and HIPAA compliant message left. Plan for 3rd outreach call in one week Chronic care management coordinator will attempt outreach in one week if call not returned.   Dudley Major RN, Maximiano Coss, CDE Chronic Care Management Coordinator [facility] Care Management [phone]

## 2018-02-11 ENCOUNTER — Encounter: Payer: Self-pay | Admitting: Interventional Cardiology

## 2018-02-11 ENCOUNTER — Telehealth: Payer: Self-pay | Admitting: Interventional Cardiology

## 2018-02-11 ENCOUNTER — Ambulatory Visit: Payer: HMO | Admitting: Interventional Cardiology

## 2018-02-11 VITALS — BP 146/64 | HR 57 | Ht 65.0 in | Wt 196.8 lb

## 2018-02-11 DIAGNOSIS — E119 Type 2 diabetes mellitus without complications: Secondary | ICD-10-CM | POA: Diagnosis not present

## 2018-02-11 DIAGNOSIS — E7849 Other hyperlipidemia: Secondary | ICD-10-CM | POA: Diagnosis not present

## 2018-02-11 DIAGNOSIS — I422 Other hypertrophic cardiomyopathy: Secondary | ICD-10-CM | POA: Diagnosis not present

## 2018-02-11 DIAGNOSIS — R9431 Abnormal electrocardiogram [ECG] [EKG]: Secondary | ICD-10-CM | POA: Diagnosis not present

## 2018-02-11 DIAGNOSIS — I10 Essential (primary) hypertension: Secondary | ICD-10-CM | POA: Diagnosis not present

## 2018-02-11 DIAGNOSIS — G4733 Obstructive sleep apnea (adult) (pediatric): Secondary | ICD-10-CM

## 2018-02-11 MED ORDER — DILTIAZEM HCL ER COATED BEADS 360 MG PO CP24: 360.0000 mg | ORAL_CAPSULE | Freq: Every day | ORAL | 3 refills | Status: DC

## 2018-02-11 NOTE — Patient Instructions (Signed)
Medication Instructions:  1) INCREASE Diltiazem to 360mg  once daily  If you need a refill on your cardiac medications before your next appointment, please call your pharmacy.   Lab work: None If you have labs (blood work) drawn today and your tests are completely normal, you will receive your results only by: [name] MyChart Message (if you have MyChart) OR . A paper copy in the mail If you have any lab test that is abnormal or we need to change your treatment, we will call you to review the results.  Testing/Procedures: Your physician has requested that you have an exercise tolerance test. For further information please visit [email or web address] Please also follow instruction sheet, as given.  Your physician recommends that you have a Coronary Calcium Score performed.  Follow-Up: At [facility], you and your health needs are our priority.  As part of our continuing mission to provide you with exceptional heart care, we have created designated Provider Care Teams.  These Care Teams include your primary Cardiologist (physician) and Advanced Practice Providers (APPs -  Physician Assistants and Nurse Practitioners) who all work together to provide you with the care you need, when you need it. You will need a follow up appointment in 12 months.  Please call our office 2 months in advance to schedule this appointment.  You may see Dr. Katrinka Blazing or one of the following Advanced Practice Providers on your designated Care Team:   Norma Fredrickson, NP Nada Boozer, NP . Georgie Chard, NP  Any Other Special Instructions Will Be Listed Below (If Applicable).

## 2018-02-11 NOTE — Telephone Encounter (Signed)
Medical records requested from Dr. Willey Blade. 02/11/18 vlm

## 2018-02-14 ENCOUNTER — Other Ambulatory Visit: Payer: Self-pay

## 2018-02-14 NOTE — Patient Outreach (Signed)
  [facility]) Care Management Chronic Special Needs Program  02/14/2018  Name: [name] [name]. DOB: May 03, 1950  MRN: [id number]  [name] is enrolled in a chronic special needs plan for Diabetes. Returned clients call after message left Client called with no answer No answer and HIPAA compliant message left. Plan for outreach call later in week if call not returned. Chronic care management coordinator will attempt outreach in 3 business days .   Dudley Major RN, Maximiano Coss, CDE Chronic Care Management Coordinator [facility] [phone]

## 2018-02-17 ENCOUNTER — Other Ambulatory Visit: Payer: Self-pay

## 2018-02-17 NOTE — Patient Outreach (Signed)
  [facility] Care Management Chronic Special Needs Program  02/17/2018  Name: [name] ComaJames E Talerico Jr. DOB: July 10, 1950  MRN: [id number]  [name] is enrolled in a chronic special needs plan for Diabetes. Client called with no answer.  #rd outreach attempt No answer and HIPAA compliant message left. Plan to complete care plan based on health risk assessment and relevant data in 3-4 business days if call not returned Chronic care management coordinator will attempt outreach in 3 Months.   Dudley MajorMelissa Sandlin RN, Maximiano CossBSN,CCM, CDE Chronic Care Management Coordinator [facility] [phone]

## 2018-02-18 DIAGNOSIS — E785 Hyperlipidemia, unspecified: Secondary | ICD-10-CM | POA: Diagnosis not present

## 2018-02-18 DIAGNOSIS — E119 Type 2 diabetes mellitus without complications: Secondary | ICD-10-CM | POA: Diagnosis not present

## 2018-02-23 ENCOUNTER — Other Ambulatory Visit: Payer: Self-pay

## 2018-02-23 NOTE — Patient Outreach (Signed)
  [facility]) Care Management Chronic Special Needs Program  02/23/2018  Name: [name] [name]. DOB: 1950/06/11  MRN: [id number]  [name] is enrolled in a chronic special needs plan for Diabetes. Client has not responded to three outreach attempts.  The client's individualized care plan was developed based upon the completed health risk assessment. Goals    . Client understands the importance of follow-up with providers by attending scheduled visits    . Client will use Assistive Devices as needed and verbalize understanding of device use    . Client will verbalize knowledge of self management of Hypertension as evidences by BP reading of 140/90 or less; or as defined by provider    . Decrease inpatient admissions/ readmissions with in the next year    . Decrease inpatient diabetes admissions/readmissions with in the year    . Decrease the use of hospital emergency department related to diabetes within the next year     . HEMOGLOBIN A1C < 7.0     Diabetes self management actions:  Glucose monitoring per provider recommendations  Perform Quality checks on blood meter  Eat Healthy  Check feet daily  Visit provider every 3-6 months as directed  Hbg A1C level every 3-6 months.  Eye Exam yearly    . Maintain timely refills of diabetic medication as prescribed within the year .    [name] Obtain annual  Lipid Profile, LDL-C    . Obtain Annual Eye (retinal)  Exam     . Obtain Annual Foot Exam    . Obtain annual screen for micro albuminuria (urine) , nephropathy (kidney problems)    . Obtain Hemoglobin A1C at least 2 times per year    . Visit Primary Care Provider or Endocrinologist at least 2 times per year       Plan:   . Send unsuccessful outreach letter with a copy of individualized care plan to client . Send information on Silver Sneakers, HTN and Diabetes . Send individualized care plan to provider . Refer to pharmacy to outreach for medication review   > 8 medications  Chronic care management coordinator will attempt outreach in 6 Months.    Dudley Major RN, Maximiano Coss, CDE Chronic Care Management Coordinator [facility] Care Management [phone]

## 2018-02-25 ENCOUNTER — Other Ambulatory Visit: Payer: Self-pay

## 2018-02-25 NOTE — Patient Outreach (Signed)
[facility]) Care Management  [facility] CM Pharmacy  02/25/2018  [name] [name]. July 03, 1950 [id number]  Reason for call:  CSNP patient needing polypharmacy medication review  Unsuccessful telephone call attempt # 1 to patient.  Spoke with his wife, who states he works M-F 6 am to 6 pm.  I left my phone number and requested that he return my call.  Plan:  I will make another outreach attempt to patient within 3-4 business days.  Berlin Hun, PharmD Clinical Pharmacist [facility] [phone]

## 2018-03-03 ENCOUNTER — Other Ambulatory Visit: Payer: Self-pay

## 2018-03-03 ENCOUNTER — Other Ambulatory Visit: Payer: Self-pay | Admitting: Interventional Cardiology

## 2018-03-03 ENCOUNTER — Ambulatory Visit: Payer: Self-pay

## 2018-03-03 NOTE — Patient Outreach (Addendum)
[facility]) Care Management  [facility] CM Pharmacy  03/03/2018  Waine Majchrzak. 10-Jun-1950 [id number]  Reason for call: CSNP patient for polypharmacy medication review  Unsuccessful telephone call attempt # 2 to patient.   HIPAA compliant voicemail left requesting a return call.  Plan:  I will make another outreach attempt in 3-4 business days.   Berlin Hun, PharmD Clinical Pharmacist [facility] [phone]

## 2018-03-04 ENCOUNTER — Ambulatory Visit (INDEPENDENT_AMBULATORY_CARE_PROVIDER_SITE_OTHER): Payer: HMO

## 2018-03-04 ENCOUNTER — Ambulatory Visit (INDEPENDENT_AMBULATORY_CARE_PROVIDER_SITE_OTHER)
Admission: RE | Admit: 2018-03-04 | Discharge: 2018-03-04 | Disposition: A | Discharge status: Home / Self Care | Payer: Self-pay | Source: Ambulatory Visit | Attending: Interventional Cardiology | Admitting: Interventional Cardiology

## 2018-03-04 ENCOUNTER — Ambulatory Visit (HOSPITAL_COMMUNITY): Payer: HMO | Attending: Cardiology

## 2018-03-04 DIAGNOSIS — I422 Other hypertrophic cardiomyopathy: Secondary | ICD-10-CM

## 2018-03-04 DIAGNOSIS — I10 Essential (primary) hypertension: Secondary | ICD-10-CM

## 2018-03-04 DIAGNOSIS — E7849 Other hyperlipidemia: Secondary | ICD-10-CM

## 2018-03-04 LAB — EXERCISE TOLERANCE TEST
Estimated workload: 7 METS
Exercise duration (min): 4 min
Exercise duration (sec): 55 s
MPHR: 155 {beats}/min
Peak HR: 115 {beats}/min
Percent HR: 74 %
RPE: 15
Rest HR: 66 {beats}/min

## 2018-03-07 ENCOUNTER — Telehealth: Payer: Self-pay | Admitting: *Deleted

## 2018-03-07 DIAGNOSIS — I10 Essential (primary) hypertension: Secondary | ICD-10-CM

## 2018-03-07 MED ORDER — LISINOPRIL 40 MG PO TABS: 40.0000 mg | ORAL_TABLET | Freq: Every day | ORAL | 3 refills | Status: DC

## 2018-03-07 NOTE — Telephone Encounter (Signed)
-----   Message from Lyn Records, MD sent at 03/05/2018 10:14 AM EST ----- Let the patient know heart strength is normal. BP control may not be adequate because there is mild increased thickness.Please increase lisinopril to 40 mg daily. @ week BMET. Needs 4-6 week OV to check progress and review test results. A copy will be sent to Gwenyth Bender, MD

## 2018-03-07 NOTE — Telephone Encounter (Signed)
Spoke with pt and went over results and recommendations per Dr.Smith.  Pt verbalized understanding and was in agreement with this plan.  Pt will have labs drawn 03/14/2018.  Scheduled pt to see Dr. Katrinka Blazing 3/13.

## 2018-03-08 ENCOUNTER — Other Ambulatory Visit: Payer: Self-pay

## 2018-03-08 NOTE — Patient Outreach (Signed)
[facility]) Care Management  [facility] CM Pharmacy  03/08/2018  Paulie Astin. 09-Jul-1950 [id number]   Reason for call: CSNP polypharmacy medication review  Unsuccessful telephone call attempt # 3 to patient.   Message left with his wife requesting a return call from patient .  Plan:  I will follow-up on 10th business day from opening case.  If no response from patient at this time, I will close [facility] case.  Berlin Hun, PharmD Clinical Pharmacist [facility] [phone]

## 2018-03-09 ENCOUNTER — Ambulatory Visit: Payer: Self-pay

## 2018-03-11 ENCOUNTER — Other Ambulatory Visit: Payer: Self-pay

## 2018-03-11 NOTE — Patient Outreach (Signed)
[facility]) Care Management [facility] CM Pharmacy  03/11/2018  [name] [name]. Nov 22, 1950 [id number]  Reason for referral: polypharmacy medication review  [facility] pharmacy case is being closed due to the following reasons:  We have been unable to establish and/or maintain contact with the patient.  Patient has been provided [facility] CM contact information if assistance needed in the future.    Thank you for allowing [facility] pharmacy to be involved in this patient's care.    Berlin Hun, PharmD Clinical Pharmacist [facility] [phone]

## 2018-03-14 ENCOUNTER — Other Ambulatory Visit: Payer: HMO | Admitting: *Deleted

## 2018-03-14 ENCOUNTER — Encounter: Payer: Self-pay | Admitting: Interventional Cardiology

## 2018-03-14 DIAGNOSIS — I10 Essential (primary) hypertension: Secondary | ICD-10-CM

## 2018-03-15 LAB — BASIC METABOLIC PANEL
BUN/Creatinine Ratio: 8 — ABNORMAL LOW (ref 10–24)
BUN: 9 mg/dL (ref 8–27)
CO2: 25 mmol/L (ref 20–29)
Calcium: 9.1 mg/dL (ref 8.6–10.2)
Chloride: 102 mmol/L (ref 96–106)
Creatinine, Ser: 1.09 mg/dL (ref 0.76–1.27)
GFR calc non Af Amer: 71 mL/min/{1.73_m2} (ref >59)
GFR, EST AFRICAN AMERICAN: 82 mL/min/{1.73_m2} (ref >59)
Glucose: 177 mg/dL — ABNORMAL HIGH (ref 65–99)
Potassium: 3.9 mmol/L (ref 3.5–5.2)
Sodium: 143 mmol/L (ref 134–144)

## 2018-03-21 ENCOUNTER — Telehealth: Payer: Self-pay | Admitting: Interventional Cardiology

## 2018-03-21 NOTE — Telephone Encounter (Signed)
The patient has been notified of the result and verbalized understanding.  All questions (if any) were answered. Sigurd Sos, RN 03/21/2018 1:30 PM

## 2018-03-21 NOTE — Telephone Encounter (Signed)
Patient returning call for lab results. 

## 2018-04-08 ENCOUNTER — Other Ambulatory Visit: Payer: Self-pay

## 2018-04-08 ENCOUNTER — Encounter: Payer: Self-pay | Admitting: Interventional Cardiology

## 2018-04-08 ENCOUNTER — Ambulatory Visit: Payer: HMO | Admitting: Interventional Cardiology

## 2018-04-08 VITALS — BP 146/62 | HR 69 | Ht 65.0 in | Wt 202.2 lb

## 2018-04-08 DIAGNOSIS — G4733 Obstructive sleep apnea (adult) (pediatric): Secondary | ICD-10-CM

## 2018-04-08 DIAGNOSIS — E7849 Other hyperlipidemia: Secondary | ICD-10-CM | POA: Diagnosis not present

## 2018-04-08 DIAGNOSIS — I422 Other hypertrophic cardiomyopathy: Secondary | ICD-10-CM

## 2018-04-08 DIAGNOSIS — I10 Essential (primary) hypertension: Secondary | ICD-10-CM | POA: Diagnosis not present

## 2018-04-08 DIAGNOSIS — I700 Atherosclerosis of aorta: Secondary | ICD-10-CM

## 2018-04-08 DIAGNOSIS — E119 Type 2 diabetes mellitus without complications: Secondary | ICD-10-CM

## 2018-04-08 MED ORDER — HYDROCHLOROTHIAZIDE 25 MG PO TABS: 25.0000 mg | ORAL_TABLET | Freq: Every day | ORAL | 3 refills | Status: DC

## 2018-04-08 NOTE — Patient Instructions (Signed)
Medication Instructions:  1) INCREASE Hydrochlorothiazide to 25mg  once daily  If you need a refill on your cardiac medications before your next appointment, please call your pharmacy.   Lab work: Your physician recommends that you return for lab work in: 2-4 weeks (BMET)  If you have labs (blood work) drawn today and your tests are completely normal, you will receive your results only by: [name] MyChart Message (if you have MyChart) OR . A paper copy in the mail If you have any lab test that is abnormal or we need to change your treatment, we will call you to review the results.  Testing/Procedures: None  Follow-Up: At [facility] you and your health needs are our priority.  As part of our continuing mission to provide you with exceptional heart care, we have created designated Provider Care Teams.  These Care Teams include your primary Cardiologist (physician) and Advanced Practice Providers (APPs -  Physician Assistants and Nurse Practitioners) who all work together to provide you with the care you need, when you need it. You will need a follow up appointment in 12 months.  Please call our office 2 months in advance to schedule this appointment.  You may see Lesleigh Noe, MD or one of the following Advanced Practice Providers on your designated Care Team:   Norma Fredrickson, NP Nada Boozer, NP . Georgie Chard, NP  Any Other Special Instructions Will Be Listed Below (If Applicable).

## 2018-04-08 NOTE — Progress Notes (Signed)
Cardiology Office Note:    Date:  04/08/2018   ID:  [name] Coma., DOB 02-09-1950, MRN [id number]  PCP:  Gwenyth Bender, MD  Cardiologist:  Lesleigh Noe, MD   Referring MD: Gwenyth Bender, MD   Chief Complaint  Patient presents with  . Congestive Heart Failure    Left ventricular hypertrophy  . Advice Only    Aortic atherosclerosis    History of Present Illness:    [name] [name]. is a 68 y.o. male with a hx of  baseline abnormal EKG, negative ischemic evaluations, hypertension, ventricular hypertrophy on echocardiography, hyperlipidemia, type 2 diabetes, and obstructive sleep apnea.  He is generally asymptomatic.  We recently performed cardiac imaging and testing noted below.  The major finding is a stable chronically abnormal EKG with pseudo-infarct pattern, echocardiography with normal regional wall motion, vigorous contractility with EF greater than 65%, and  mild LV hypertrophy.  There was no evidence of ischemia on treadmill testing.  There was a hypertensive blood pressure response.  Kasie a sedentary and asymptomatic.  Total duration on the treadmill was 4 minutes before he has to stop.   Past Medical History:  Diagnosis Date  . Abnormal EKG    Continue on Aspirin 81mg , Diagnostic Imaging:EC Stress test midmark (ordered for 03-02-2012)  . Allergic rhinitis   . Diabetes mellitus, type 2 (HCC)   . Hyperlipidemia    stable  . Hypertension   . Obesity, mild    with 6 lbs weight loss  . OSA (obstructive sleep apnea)   . Periodontal disease     Past Surgical History:  Procedure Laterality Date  . WISDOM TOOTH EXTRACTION      Current Medications: Current Meds  Medication Sig  . aspirin 81 MG tablet Take 81 mg by mouth daily.    [name] diltiazem (CARDIZEM CD) 360 MG 24 hr capsule Take 1 capsule (360 mg total) by mouth daily.  [name] glimepiride (AMARYL) 2 MG tablet Take 2 mg by mouth daily.   . insulin aspart (NOVOLOG) 100 unit/mL injection Take as directed per sliding  scale.  [name] LANTUS 100 UNIT/ML injection   . lisinopril (PRINIVIL,ZESTRIL) 40 MG tablet Take 1 tablet (40 mg total) by mouth daily.  . Multiple Vitamin (MULTIVITAMIN) tablet Take 1 tablet by mouth daily.    . simvastatin (ZOCOR) 40 MG tablet TAKE ONE TABLET AT BEDTIME.  . sitaGLIPtin-metformin (JANUMET) 50-1000 MG tablet Take 1 tablet by mouth daily with supper.  . [DISCONTINUED] hydrochlorothiazide 25 MG tablet Take 12.5 mg by mouth daily.       Allergies:   Patient has no known allergies.   Social History   Socioeconomic History  . Marital status: Married    Spouse name: Not on file  . Number of children: Not on file  . Years of education: Not on file  . Highest education level: Not on file  Occupational History  . Occupation: Retired    Comment: IT trainer with [employer]  Social Needs  . Financial resource strain: Not on file  . Food insecurity:    Worry: Never true    Inability: Never true  . Transportation needs:    Medical: No    Non-medical: No  Tobacco Use  . Smoking status: Former Smoker    Packs/day: 0.20    Years: 17.00    Pack years: 3.40    Types: Cigarettes    Last attempt to quit: 01/27/1983    Years since quitting: 35.2  .  Smokeless tobacco: Never Used  Substance and Sexual Activity  . Alcohol use: Yes    Comment: once a month  . Drug use: No  . Sexual activity: Yes  Lifestyle  . Physical activity:    Days per week: Not on file    Minutes per session: Not on file  . Stress: Not on file  Relationships  . Social connections:    Talks on phone: Not on file    Gets together: Not on file    Attends religious service: Not on file    Active member of club or organization: Not on file    Attends meetings of clubs or organizations: Not on file    Relationship status: Not on file  Other Topics Concern  . Not on file  Social History Narrative  . Not on file     Family History: The patient's family history includes Diabetes in an other family  member; Heart attack in his father and mother.  ROS:   Please see the history of present illness.    No complaints all other systems reviewed and are negative.  EKGs/Labs/Other Studies Reviewed:    The following studies were reviewed today:  Coronary calcium score February 2020: IMPRESSION: Coronary calcium score of 0.  Some calcific aortic valve disease and atherosclerosis of the aortic root  2D Doppler echocardiogram February 2020: IMPRESSIONS  1. The left ventricle has hyperdynamic systolic function of >65%. The cavity size was normal. There is mildly increased left ventricular wall thickness. Echo evidence of impaired diastolic relaxation.  2. The right ventricle has normal systolic function. The cavity was normal. There is no increase in right ventricular wall thickness.  3. Left atrial size was moderately dilated.  4. The mitral valve is normal in structure.  5. The tricuspid valve is normal in structure.  6. The aortic valve is tricuspid There is mild thickening of the aortic valve.  7. The pulmonic valve was normal in structure.  8. The interatrial septum is aneurysmal.  9. Vigorous LV systolic function; mild diastolic dysfunction; mild LVH; elevated LVOT velocity of 2.5 m/s likely related to vigorous LV function; aortic valve opens well and no SAM noted; moderate LAE.  Exercise treadmill test 03/04/2018:  Study Highlights    Blood pressure demonstrated a hypertensive response to exercise.  Clinically and electrically negative for ischemia        EKG:  EKG most recent EKG February 11, 2018 demonstrates possible inferior infarct with inferior T wave abnormality as well as precordial T wave abnormality.  This is a stable EKG appearance over the past 5 years.  Recent Labs: 03/14/2018: BUN 9; Creatinine, Ser 1.09; Potassium 3.9; Sodium 143  Recent Lipid Panel No results found for: CHOL, TRIG, HDL, CHOLHDL, VLDL, LDLCALC, LDLDIRECT  Physical Exam:    VS:  BP (!)  146/62   Pulse 69   Ht  (1.651 m)   Wt 202 lb 3.2 oz (91.7 kg)   SpO2 95%   BMI 33.65 kg/m      Wt Readings from Last 3 Encounters:  04/08/18 202 lb 3.2 oz (91.7 kg)  02/11/18 196 lb 12.8 oz (89.3 kg)  01/12/17 191 lb 12.8 oz (87 kg)     GEN: Bees. No acute distress HEENT: Normal NECK: No JVD. LYMPHATICS: No lymphadenopathy CARDIAC: RRR.  No murmur, no gallop, no edema VASCULAR: 2+ bilateral radial pulses, no bruits RESPIRATORY:  Clear to auscultation without rales, wheezing or rhonchi  ABDOMEN: Soft, non-tender, non-distended, No  pulsatile mass, MUSCULOSKELETAL: No deformity  SKIN: Warm and dry NEUROLOGIC:  Alert and oriented x 3 PSYCHIATRIC:  Normal affect   ASSESSMENT:    1. Essential hypertension   2. Other hyperlipidemia   3. Hypertrophic cardiomyopathy (HCC)   4. Obstructive sleep apnea   5. Type 2 diabetes mellitus without complication, without long-term current use of insulin (HCC)   6. Aortic atherosclerosis (HCC)    PLAN:    In order of problems listed above:  1. Blood pressure is too high.  Initial blood pressure 170/80 mmHg.  Increase HCTZ to 25 mg/day.  Continue lisinopril 40 mg daily, diltiazem 360 mg/day, basic metabolic panel in 2 to 4 weeks.  If blood pressure remains elevated, add beta-blocker therapy, perhaps by systolic. 2. Excellent LDL on simvastatin 40 mg/day.  Target less than 70. 3. Left ventricular hypertrophy is likely related to hypertension.  EKG has been abnormal for as long as I have known him, raising the question of genetically transmitted/mutational hypertrophic cardiomyopathy.  With only mild hypertrophy, we will not do further imaging with MRI but this could become something that needs to be done in the future. 4. We discussed managing sleep apnea and the importance it has related to blood pressure control. 5. A1c target should be less than 7.  Need to consider SGLT2/GLP-1 agonist therapy for diabetes control because they provide  additional cardioprotection.  We will leave this to Dr. August Saucer. 6. Secondary prevention as noted below will prevent progression of atherosclerosis.  Overall today we have decided to increase HCTZ to 25 mg daily.  Bmet in 2 to 4 weeks.  Target blood pressure 130/80 mmHg.  Add beta-blocker therapy in the form of Bystolic in the future if needed.  If Bystolic is added, diltiazem will need to be changed to amlodipine to prevent excessive bradycardia.  Low-salt diet and weight loss are discussed.  Overall education and awareness concerning primary/secondary risk prevention was discussed in detail: LDL less than 70, hemoglobin A1c less than 7, blood pressure target less than 130/80 mmHg, >150 minutes of moderate aerobic activity per week, avoidance of smoking, weight control (via diet and exercise), and continued surveillance/management of/for obstructive sleep apnea.  1 year follow-up.   Medication Adjustments/Labs and Tests Ordered: Current medicines are reviewed at length with the patient today.  Concerns regarding medicines are outlined above.  Orders Placed This Encounter  Procedures  . Basic metabolic panel   Meds ordered this encounter  Medications  . hydrochlorothiazide (HYDRODIURIL) 25 MG tablet    Sig: Take 1 tablet (25 mg total) by mouth daily.    Dispense:  90 tablet    Refill:  3    Dose change    Patient Instructions  Medication Instructions:  1) INCREASE Hydrochlorothiazide to 25mg  once daily  If you need a refill on your cardiac medications before your next appointment, please call your pharmacy.   Lab work: Your physician recommends that you return for lab work in: 2-4 weeks (BMET)  If you have labs (blood work) drawn today and your tests are completely normal, you will receive your results only by: [name] MyChart Message (if you have MyChart) OR . A paper copy in the mail If you have any lab test that is abnormal or we need to change your treatment, we will call you to  review the results.  Testing/Procedures: None  Follow-Up: At [facility], you and your health needs are our priority.  As part of our continuing mission to provide you  with exceptional heart care, we have created designated Provider Care Teams.  These Care Teams include your primary Cardiologist (physician) and Advanced Practice Providers (APPs -  Physician Assistants and Nurse Practitioners) who all work together to provide you with the care you need, when you need it. You will need a follow up appointment in 12 months.  Please call our office 2 months in advance to schedule this appointment.  You may see Lesleigh Noe, MD or one of the following Advanced Practice Providers on your designated Care Team:   Norma Fredrickson, NP Nada Boozer, NP . Georgie Chard, NP  Any Other Special Instructions Will Be Listed Below (If Applicable).       Signed, Lesleigh Noe, MD  04/08/2018 11:16 AM    [facility]

## 2018-05-11 ENCOUNTER — Telehealth: Payer: Self-pay | Admitting: Interventional Cardiology

## 2018-05-11 NOTE — Telephone Encounter (Signed)
Spoke with wife and made her aware he did need to keep appt. Wife appreciative for call.

## 2018-05-11 NOTE — Telephone Encounter (Signed)
New Message   Patients wife is calling to see if patient needs to still come in for labs on Friday. Please call a vm can be left as well.

## 2018-05-12 ENCOUNTER — Telehealth: Payer: Self-pay | Admitting: Interventional Cardiology

## 2018-05-12 NOTE — Telephone Encounter (Signed)
F/U Message           Patient called today  stated he is confused about  Getting a phone call about his appointment when he already know about it. Then he asked to speak to a Renee,he didn't speak to a Luster Landsberg he spoke to [place], so when I told him I didn't have a message from a Renee he said ok no reason to talk further and said bye and hung up. Just an [name] [name]

## 2018-05-12 NOTE — Telephone Encounter (Signed)
Called pt, apologized for the misunderstanding. Informed that front office Luster Landsberg Durel Salts) was calling to confirm he was still coming to his lab appt tomorrow. Pt is agreeable to plan and appreciates my following up.

## 2018-05-13 ENCOUNTER — Other Ambulatory Visit: Payer: HMO | Admitting: *Deleted

## 2018-05-13 ENCOUNTER — Other Ambulatory Visit: Payer: Self-pay

## 2018-05-13 DIAGNOSIS — I2510 Atherosclerotic heart disease of native coronary artery without angina pectoris: Secondary | ICD-10-CM | POA: Diagnosis not present

## 2018-05-13 DIAGNOSIS — G4730 Sleep apnea, unspecified: Secondary | ICD-10-CM | POA: Diagnosis not present

## 2018-05-13 DIAGNOSIS — I10 Essential (primary) hypertension: Secondary | ICD-10-CM | POA: Diagnosis not present

## 2018-05-13 DIAGNOSIS — E119 Type 2 diabetes mellitus without complications: Secondary | ICD-10-CM | POA: Diagnosis not present

## 2018-05-13 DIAGNOSIS — I422 Other hypertrophic cardiomyopathy: Secondary | ICD-10-CM

## 2018-05-13 LAB — BASIC METABOLIC PANEL
BUN/Creatinine Ratio: 8 — ABNORMAL LOW (ref 10–24)
BUN: 11 mg/dL (ref 8–27)
CO2: 25 mmol/L (ref 20–29)
Calcium: 9.4 mg/dL (ref 8.6–10.2)
Chloride: 102 mmol/L (ref 96–106)
Creatinine, Ser: 1.31 mg/dL — ABNORMAL HIGH (ref 0.76–1.27)
GFR calc Af Amer: 66 mL/min/{1.73_m2} (ref >59)
GFR calc non Af Amer: 57 mL/min/{1.73_m2} — ABNORMAL LOW (ref >59)
Glucose: 79 mg/dL (ref 65–99)
Potassium: 4.2 mmol/L (ref 3.5–5.2)
Sodium: 143 mmol/L (ref 134–144)

## 2018-05-18 ENCOUNTER — Telehealth: Payer: Self-pay | Admitting: *Deleted

## 2018-05-18 DIAGNOSIS — I10 Essential (primary) hypertension: Secondary | ICD-10-CM

## 2018-05-18 NOTE — Telephone Encounter (Signed)
-----   Message from Lyn Records, MD sent at 05/13/2018  4:41 PM EDT ----- Let the patient know his kidney numbers are minimally elevated. Encourage increase water intake. Repeat BMET with me or Dr. August Saucer in 3 months. A copy will be sent to Gwenyth Bender, MD

## 2018-05-18 NOTE — Telephone Encounter (Signed)
Informed pt of results. Pt verbalized understanding. 

## 2018-06-02 DIAGNOSIS — Z20828 Contact with and (suspected) exposure to other viral communicable diseases: Secondary | ICD-10-CM | POA: Diagnosis not present

## 2018-08-24 ENCOUNTER — Other Ambulatory Visit: Payer: Self-pay

## 2018-08-24 NOTE — Patient Outreach (Signed)
  [facility]) Care Management Chronic Special Needs Program  08/24/2018  Name: [name] [name]. DOB: [birth year]  MRN: [id number]  [name] is enrolled in a chronic special needs plan for Diabetes. Client called with no answer No answer and HIPAA compliant message left.  1st attempt Plan for 2nd outreach call in one week Chronic care management coordinator will attempt outreach in one week.   Peter Garter RN, Jackquline Denmark, CDE Chronic Care Management Coordinator [facility] Care Management [phone]

## 2018-08-26 ENCOUNTER — Other Ambulatory Visit: Payer: HMO

## 2018-08-29 ENCOUNTER — Other Ambulatory Visit: Payer: Self-pay

## 2018-08-29 NOTE — Patient Outreach (Signed)
  [facility]) Care Management Chronic Special Needs Program  08/29/2018  Name: [name] [name]. DOB: 08/26/1950  MRN: [id number]  Mr. Blandon Offerdahl is enrolled in a chronic special needs plan for Diabetes. Client called with no answer No answer and HIPAA compliant message left. 2nd attempt Plan for 3rd outreach call in one week Chronic care management coordinator will attempt outreach in one week.   Peter Garter RN, Jackquline Denmark, CDE Chronic Care Management Coordinator [facility] Management [phone]

## 2018-09-02 DIAGNOSIS — G4733 Obstructive sleep apnea (adult) (pediatric): Secondary | ICD-10-CM | POA: Diagnosis not present

## 2018-09-02 DIAGNOSIS — E785 Hyperlipidemia, unspecified: Secondary | ICD-10-CM | POA: Diagnosis not present

## 2018-09-02 DIAGNOSIS — I10 Essential (primary) hypertension: Secondary | ICD-10-CM | POA: Diagnosis not present

## 2018-09-02 DIAGNOSIS — E782 Mixed hyperlipidemia: Secondary | ICD-10-CM | POA: Diagnosis not present

## 2018-09-02 DIAGNOSIS — Z733 Stress, not elsewhere classified: Secondary | ICD-10-CM | POA: Diagnosis not present

## 2018-09-02 DIAGNOSIS — E119 Type 2 diabetes mellitus without complications: Secondary | ICD-10-CM | POA: Diagnosis not present

## 2018-09-05 ENCOUNTER — Other Ambulatory Visit: Payer: Self-pay

## 2018-09-05 NOTE — Patient Outreach (Signed)
  [facility]) Care Management Chronic Special Needs Program  09/05/2018  Name: [name] [name]. DOB: May 02, 1950  MRN: [id number]  Mr. Victoriano Campion is enrolled in a chronic special needs plan for Diabetes. Reviewed and updated care plan.  Client has not responded to 3 outreach attempts by [facility] to update individualized care plan  The client's individualized care plan was developed based on available data and Health Risk Assessment   Goals Addressed            This Visit's Progress   . Client understands the importance of follow-up with providers by attending scheduled visits   On track   . Client will use Assistive Devices as needed and verbalize understanding of device use   On track   . Client will verbalize knowledge of self management of Hypertension as evidences by BP reading of 140/90 or less; or as defined by provider   On track   . Decrease inpatient admissions/ readmissions with in the next year   On track   . Decrease inpatient diabetes admissions/readmissions with in the year   On track   . Decrease the use of hospital emergency department related to diabetes within the next year    On track   . HEMOGLOBIN A1C < 7.0       Diabetes self management actions:  Glucose monitoring per provider recommendations  Eat Healthy  Check feet daily  Visit provider every 3-6 months as directed  Hbg A1C level every 3-6 months.  Eye Exam yearly    . Maintain timely refills of diabetic medication as prescribed within the year .   On track   . Obtain annual  Lipid Profile, LDL-C   On track   . Obtain Annual Eye (retinal)  Exam    On track   . Obtain Annual Foot Exam   On track   . Obtain annual screen for micro albuminuria (urine) , nephropathy (kidney problems)   On track   . Obtain Hemoglobin A1C at least 2 times per year   On track   . Visit Primary Care Provider or Endocrinologist at least 2 times per year    On track    Client had last Hemoglobin A1C reading  of 6.7%.  Client did not respond to pharmacy outreach attempts for medication review.  Plan:  Send unsuccessful outreach letter with a copy of their individualized care plan and Send individual care plan to provider  Chronic care management coordinator will outreach in:  4 months     [place], [facility] [place] Management Coordinator [place] Management [phone]

## 2018-09-15 ENCOUNTER — Telehealth: Payer: Self-pay | Admitting: Interventional Cardiology

## 2018-09-15 NOTE — Telephone Encounter (Signed)
Pt states he is concerned about his blood pressure fluctuating and he thinks it may be due to added stress since his wife has been sick. Pt does not have any current blood pressure readings.   Pt is very upset that he was called back by a triage nurse rather than Dr. Tamala Julian or his nurse. Pt does not want to speak with anyone other than Dr. Tamala Julian regarding his health concerns. Pt is frustrated that he does not have direct access to Dr. Tamala Julian or his nurse at this time. I advised pt that as a triage nurse I am here to assist being that Dr. Tamala Julian and Marveen Reeks, RN are not in office today. Pt reiterated he has no intention of discussing his concerns with me or anyone in triage at this time.   I advised pt to keep a log of his BP readings and to call back with any urgent symptoms in the meantime but I will route this to Dr. Tamala Julian as well as Hiram Comber, RN so that they can reach out and discuss concerns with patient.

## 2018-09-15 NOTE — Telephone Encounter (Signed)
New Message:    Please call, pt says he had some issues that he wanted to discuss with Dr Tamala Julian or his nurse.

## 2018-09-15 NOTE — Telephone Encounter (Signed)
I spoke to the patient . We will try to get him in.

## 2018-09-16 NOTE — Telephone Encounter (Signed)
Left message to call back  

## 2018-09-19 NOTE — Telephone Encounter (Signed)
Left message for pt to call back.  He can be scheduled 10/2 at 10A or 1140A.

## 2018-09-19 NOTE — Telephone Encounter (Signed)
Spoke with pt and scheduled him to come into the office 10/28/2018.  Pt in agreement with plan.

## 2018-09-30 DIAGNOSIS — Z733 Stress, not elsewhere classified: Secondary | ICD-10-CM | POA: Diagnosis not present

## 2018-09-30 DIAGNOSIS — Z712 Person consulting for explanation of examination or test findings: Secondary | ICD-10-CM | POA: Diagnosis not present

## 2018-09-30 DIAGNOSIS — E782 Mixed hyperlipidemia: Secondary | ICD-10-CM | POA: Diagnosis not present

## 2018-09-30 DIAGNOSIS — I10 Essential (primary) hypertension: Secondary | ICD-10-CM | POA: Diagnosis not present

## 2018-09-30 DIAGNOSIS — E119 Type 2 diabetes mellitus without complications: Secondary | ICD-10-CM | POA: Diagnosis not present

## 2018-09-30 DIAGNOSIS — G4733 Obstructive sleep apnea (adult) (pediatric): Secondary | ICD-10-CM | POA: Diagnosis not present

## 2018-10-26 NOTE — Progress Notes (Signed)
Cardiology Office Note:    Date:  10/28/2018   ID:  [name] Coma., DOB Nov 08, 1950, MRN [id number]  PCP:  Gwenyth Bender, MD  Cardiologist:  Lesleigh Noe, MD   Referring MD: Gwenyth Bender, MD   Chief Complaint  Patient presents with  . Hyperlipidemia  . Hypertension  . Advice Only    Diabetes mellitus    History of Present Illness:    [name] [name]. is a 68 y.o. male with a hx of baseline abnormal EKG, negative ischemic evaluations, hypertension, ventricular hypertrophy on echocardiography, hyperlipidemia, type 2 diabetes, and obstructive sleep apnea.  Overall, [name] [name] is doing well.  He is here today for cardiac/blood pressure follow-up.  He has not yet taken his medication for today.  We had a conversation 6 weeks ago concerning blood pressure recordings that were significantly elevated.  Turns out he was taken his blood pressures towards the end of pharmacologic affect/prior to taking his daily therapy.  I instructed him to start taking the blood pressure at least 2 hours after he takes his medication each morning and he has noted significant improvement with blood pressures running between 135 and 145 mmHg.  He did not bring a recording but states that this morning he was 143/70.  The blood pressure in the office today is significantly higher than that.  Past Medical History:  Diagnosis Date  . Abnormal EKG    Continue on Aspirin 81mg , Diagnostic Imaging:EC Stress test midmark (ordered for 03-02-2012)  . Allergic rhinitis   . Diabetes mellitus, type 2 (HCC)   . Hyperlipidemia    stable  . Hypertension   . Obesity, mild    with 6 lbs weight loss  . OSA (obstructive sleep apnea)   . Periodontal disease     Past Surgical History:  Procedure Laterality Date  . WISDOM TOOTH EXTRACTION      Current Medications: Current Meds  Medication Sig  . aspirin 81 MG tablet Take 81 mg by mouth daily.    04-30-2012 diltiazem (CARDIZEM CD) 360 MG 24 hr capsule Take 1 capsule (360 mg  total) by mouth daily.  [name] glimepiride (AMARYL) 2 MG tablet Take 2 mg by mouth daily.   . hydrochlorothiazide (HYDRODIURIL) 25 MG tablet Take 1 tablet (25 mg total) by mouth daily.  . insulin aspart (NOVOLOG) 100 unit/mL injection Take as directed per sliding scale.  [name] LANTUS 100 UNIT/ML injection   . lisinopril (PRINIVIL,ZESTRIL) 40 MG tablet Take 1 tablet (40 mg total) by mouth daily.  . Multiple Vitamin (MULTIVITAMIN) tablet Take 1 tablet by mouth daily.    . simvastatin (ZOCOR) 40 MG tablet TAKE ONE TABLET AT BEDTIME.  . sitaGLIPtin-metformin (JANUMET) 50-1000 MG tablet Take 1 tablet by mouth daily with supper.     Allergies:   Patient has no known allergies.   Social History   Socioeconomic History  . Marital status: Married    Spouse name: Not on file  . Number of children: Not on file  . Years of education: Not on file  . Highest education level: Not on file  Occupational History  . Occupation: Retired    Comment: [name] with IT trainer  Social Needs  . Financial resource strain: Not on file  . Food insecurity    Worry: Never true    Inability: Never true  . Transportation needs    Medical: No    Non-medical: No  Tobacco Use  . Smoking status: Former Smoker  Packs/day: 0.20    Years: 17.00    Pack years: 3.40    Types: Cigarettes    Quit date: 01/27/1983    Years since quitting: 35.7  . Smokeless tobacco: Never Used  Substance and Sexual Activity  . Alcohol use: Yes    Comment: once a month  . Drug use: No  . Sexual activity: Yes  Lifestyle  . Physical activity    Days per week: Not on file    Minutes per session: Not on file  . Stress: Not on file  Relationships  . Social Herbalist on phone: Not on file    Gets together: Not on file    Attends religious service: Not on file    Active member of club or organization: Not on file    Attends meetings of clubs or organizations: Not on file    Relationship status: Not on file  Other  Topics Concern  . Not on file  Social History Narrative  . Not on file     Family History: The patient's family history includes Diabetes in an other family member; Heart attack in his father and mother.  ROS:   Please see the history of present illness.    Under stress related to his wife's pancreatic cancer and chemotherapy.  She successfully got through Whipple procedure for pancreatic cancer.  [facility] is being cared for at [facility].  He is quite emotionally disturbed today because of the social climate.  All other systems reviewed and are negative.  EKGs/Labs/Other Studies Reviewed:    The following studies were reviewed today: No new data  EKG:  EKG EKG today demonstrates poor R wave progression V1 through V4, sinus rhythm, inferior T wave abnormality.  When compared to the prior tracings from January 2020, the anterolateral T wave inversion is less extensive.  Overall there is improvement in appearance.  Recent Labs: 05/13/2018: BUN 11; Creatinine, Ser 1.31; Potassium 4.2; Sodium 143  Recent Lipid Panel No results found for: CHOL, TRIG, HDL, CHOLHDL, VLDL, LDLCALC, LDLDIRECT  Physical Exam:    VS:  BP (!) 164/86   Pulse 63   Ht 5\' 5"  (1.651 m)   Wt 204 lb (92.5 kg)   SpO2 96%   BMI 33.95 kg/m     Wt Readings from Last 3 Encounters:  10/28/18 204 lb (92.5 kg)  04/08/18 202 lb 3.2 oz (91.7 kg)  02/11/18 196 lb 12.8 oz (89.3 kg)     GEN: Obese.. No acute distress HEENT: Normal NECK: No JVD. LYMPHATICS: No lymphadenopathy CARDIAC:  RRR without murmur, gallop, or edema. VASCULAR:  Normal Pulses. No bruits. RESPIRATORY:  Clear to auscultation without rales, wheezing or rhonchi  ABDOMEN: Soft, non-tender, non-distended, No pulsatile mass, MUSCULOSKELETAL: No deformity  SKIN: Warm and dry NEUROLOGIC:  Alert and oriented x 3 PSYCHIATRIC:  Normal affect   ASSESSMENT:    1. Essential hypertension   2. Other hyperlipidemia   3. Obstructive sleep apnea   4. Type 2 diabetes  mellitus without complication, without long-term current use of insulin ([place])   5. Aortic atherosclerosis ([place])   6. Abnormal EKG   7. Educated about COVID-19 virus infection    PLAN:    In order of problems listed above:  1. Blood pressure is elevated today.  He is not taken his medication yet today.  Recordings at home have been improved compared to prior.  I have asked him to take his blood pressures between 2 and 8 hours  after his morning therapy.  He did not do that today and is not yet had therapy. 2. Recent laboratory data has been done by his primary physician Dr. August Saucerean.  These results are not available. 3. CPAP is being used 4. A1c levels and not known.  Followed by Dr. August Saucerean 5. Primary prevention is discussed in detail.  Encourage him to increase aerobic activity. 6. Social distancing, mask wearing, and handwashing is encouraged.  Overall education and awareness concerning primary/secondary risk prevention was discussed in detail: LDL less than 70, hemoglobin A1c less than 7, blood pressure target less than 130/80 mmHg, >150 minutes of moderate aerobic activity per week, avoidance of smoking, weight control (via diet and exercise), and continued surveillance/management of/for obstructive sleep apnea.   Medication Adjustments/Labs and Tests Ordered: Current medicines are reviewed at length with the patient today.  Concerns regarding medicines are outlined above.  Orders Placed This Encounter  Procedures  . EKG 12-Lead   No orders of the defined types were placed in this encounter.   Patient Instructions  Medication Instructions:  Your physician recommends that you continue on your current medications as directed. Please refer to the Current Medication list given to you today.  If you need a refill on your cardiac medications before your next appointment, please call your pharmacy.   Lab work: None If you have labs (blood work) drawn today and your tests are completely  normal, you will receive your results only by: [name]. MyChart Message (if you have MyChart) OR . A paper copy in the mail If you have any lab test that is abnormal or we need to change your treatment, we will call you to review the results.  Testing/Procedures: None  Follow-Up: At [facility], you and your health needs are our priority.  As part of our continuing mission to provide you with exceptional heart care, we have created designated Provider Care Teams.  These Care Teams include your primary Cardiologist (physician) and Advanced Practice Providers (APPs -  Physician Assistants and Nurse Practitioners) who all work together to provide you with the care you need, when you need it. You will need a follow up appointment in 6 months.  Please call our office 2 months in advance to schedule this appointment.  You may see Lesleigh NoeHenry W Coda Filler III, MD or one of the following Advanced Practice Providers on your designated Care Team:   Norma FredricksonLori Gerhardt, NP Nada BoozerLaura Ingold, NP . Georgie ChardJill McDaniel, NP  Any Other Special Instructions Will Be Listed Below (If Applicable).       Signed, Lesleigh NoeHenry W Thorvald Orsino III, MD  10/28/2018 12:27 PM    [facility]

## 2018-10-28 ENCOUNTER — Encounter: Payer: Self-pay | Admitting: Interventional Cardiology

## 2018-10-28 ENCOUNTER — Other Ambulatory Visit: Payer: Self-pay

## 2018-10-28 ENCOUNTER — Encounter (INDEPENDENT_AMBULATORY_CARE_PROVIDER_SITE_OTHER): Payer: Self-pay

## 2018-10-28 ENCOUNTER — Ambulatory Visit: Payer: HMO | Admitting: Interventional Cardiology

## 2018-10-28 VITALS — BP 164/86 | HR 63 | Ht 65.0 in | Wt 204.0 lb

## 2018-10-28 DIAGNOSIS — G4733 Obstructive sleep apnea (adult) (pediatric): Secondary | ICD-10-CM

## 2018-10-28 DIAGNOSIS — R9431 Abnormal electrocardiogram [ECG] [EKG]: Secondary | ICD-10-CM | POA: Diagnosis not present

## 2018-10-28 DIAGNOSIS — E7849 Other hyperlipidemia: Secondary | ICD-10-CM | POA: Diagnosis not present

## 2018-10-28 DIAGNOSIS — I700 Atherosclerosis of aorta: Secondary | ICD-10-CM

## 2018-10-28 DIAGNOSIS — I10 Essential (primary) hypertension: Secondary | ICD-10-CM | POA: Diagnosis not present

## 2018-10-28 DIAGNOSIS — Z7189 Other specified counseling: Secondary | ICD-10-CM

## 2018-10-28 DIAGNOSIS — E119 Type 2 diabetes mellitus without complications: Secondary | ICD-10-CM | POA: Diagnosis not present

## 2018-10-28 NOTE — Patient Instructions (Signed)

## 2018-12-17 DIAGNOSIS — Z23 Encounter for immunization: Secondary | ICD-10-CM | POA: Diagnosis not present

## 2018-12-30 DIAGNOSIS — E782 Mixed hyperlipidemia: Secondary | ICD-10-CM | POA: Diagnosis not present

## 2018-12-30 DIAGNOSIS — E119 Type 2 diabetes mellitus without complications: Secondary | ICD-10-CM | POA: Diagnosis not present

## 2018-12-30 DIAGNOSIS — Z733 Stress, not elsewhere classified: Secondary | ICD-10-CM | POA: Diagnosis not present

## 2018-12-30 DIAGNOSIS — I10 Essential (primary) hypertension: Secondary | ICD-10-CM | POA: Diagnosis not present

## 2018-12-30 DIAGNOSIS — G4733 Obstructive sleep apnea (adult) (pediatric): Secondary | ICD-10-CM | POA: Diagnosis not present

## 2018-12-30 DIAGNOSIS — R011 Cardiac murmur, unspecified: Secondary | ICD-10-CM | POA: Diagnosis not present

## 2019-01-03 DIAGNOSIS — E782 Mixed hyperlipidemia: Secondary | ICD-10-CM | POA: Diagnosis not present

## 2019-01-03 DIAGNOSIS — E119 Type 2 diabetes mellitus without complications: Secondary | ICD-10-CM | POA: Diagnosis not present

## 2019-01-05 ENCOUNTER — Other Ambulatory Visit: Payer: Self-pay

## 2019-01-05 NOTE — Patient Outreach (Signed)
  [facility]) Care Management Chronic Special Needs Program  01/05/2019  Name: [name] [name]. DOB: 1950-08-11  MRN: [id number]  [name] is enrolled in a chronic special needs plan for Diabetes. Reviewed and updated care plan.  Subjective: Client states that he does not have time to talk with RNCM and does not want any more calls for a while.  States he will call RNCM if needed.    The client's individualized care plan was developed based on available data and Health Risk  Goals Addressed            This Visit's Progress   . Client understands the importance of follow-up with providers by attending scheduled visits   On track   . Client will use Assistive Devices as needed and verbalize understanding of device use   On track   . Client will verbalize knowledge of self management of Hypertension as evidences by BP reading of 140/90 or less; or as defined by provider   On track   . COMPLETED: Decrease inpatient admissions/ readmissions with in the next year   On track    No admissions    . COMPLETED: Decrease inpatient diabetes admissions/readmissions with in the year   On track    No admissions    . COMPLETED: Decrease the use of hospital emergency department related to diabetes within the next year    On track    No ED visits    . HEMOGLOBIN A1C < 7.0        Diabetes self management actions:  Glucose monitoring per provider recommendations  Eat Healthy  Check feet daily  Visit provider every 3-6 months as directed  Hbg A1C level every 3-6 months.  Eye Exam yearly    . Maintain timely refills of diabetic medication as prescribed within the year .   On track   . COMPLETED: Obtain annual  Lipid Profile, LDL-C       Completed 02/21/18    . Obtain Annual Eye (retinal)  Exam    Not on track   . Obtain Annual Foot Exam   Not on track   . Obtain annual screen for micro albuminuria (urine) , nephropathy (kidney problems)   Not on track   . Obtain  Hemoglobin A1C at least 2 times per year   On track    Completed 02/21/18    . COMPLETED: Visit Primary Care Provider or Endocrinologist at least 2 times per year        Completed  05/13/18,09/02/18,10/28/18     Client is  meeting diabetes self management goal of hemoglobin A1C of <7% with last reading of 6.7% Client does not have time to talk and does not wish to reschedule at another time.  Agreeable to [facility] updating his Interdisciplinary care plan and he will contact RNCM if needed  Plan:  Send successful outreach letter with a copy of their individualized care plan and Send individual care plan to provider  Chronic care management coordinator will outreach in:  6 Months per tier level     Peter Garter RN, Jackquline Denmark, [employer] Management Coordinator [facility] [phone]

## 2019-02-22 ENCOUNTER — Other Ambulatory Visit: Payer: Self-pay | Admitting: Interventional Cardiology

## 2019-03-07 ENCOUNTER — Other Ambulatory Visit: Payer: Self-pay | Admitting: Interventional Cardiology

## 2019-03-25 ENCOUNTER — Other Ambulatory Visit: Payer: Self-pay | Admitting: Interventional Cardiology

## 2019-03-30 ENCOUNTER — Ambulatory Visit: Payer: HMO | Attending: Internal Medicine

## 2019-03-30 DIAGNOSIS — Z23 Encounter for immunization: Secondary | ICD-10-CM | POA: Insufficient documentation

## 2019-03-30 NOTE — Progress Notes (Signed)
   [id number]  Name:  [name] [name].    MRN: [id number] DOB: Apr 02, 1950  03/30/2019  [name] was observed post Covid-19 immunization for 15 minutes without incident. He was provided with Vaccine Information Sheet and instruction to access the V-Safe system.   [name] was instructed to call 911 with any severe reactions post vaccine: [name] Difficulty breathing  . Swelling of face and throat  . A fast heartbeat  . A bad rash all over body  . Dizziness and weakness   Immunizations Administered    Name Date Dose VIS Date Route   Pfizer COVID-19 Vaccine 03/30/2019  4:56 PM 0.3 mL 01/06/2019 Intramuscular   Manufacturer: [employer], [employer]   Lot: [id number]   NDC: [address]-6

## 2019-04-14 DIAGNOSIS — Z6831 Body mass index (BMI) 31.0-31.9, adult: Secondary | ICD-10-CM | POA: Diagnosis not present

## 2019-04-14 DIAGNOSIS — G4733 Obstructive sleep apnea (adult) (pediatric): Secondary | ICD-10-CM | POA: Diagnosis not present

## 2019-04-14 DIAGNOSIS — E119 Type 2 diabetes mellitus without complications: Secondary | ICD-10-CM | POA: Diagnosis not present

## 2019-04-14 DIAGNOSIS — I160 Hypertensive urgency: Secondary | ICD-10-CM | POA: Diagnosis not present

## 2019-04-14 DIAGNOSIS — Z733 Stress, not elsewhere classified: Secondary | ICD-10-CM | POA: Diagnosis not present

## 2019-04-14 DIAGNOSIS — R011 Cardiac murmur, unspecified: Secondary | ICD-10-CM | POA: Diagnosis not present

## 2019-04-14 DIAGNOSIS — R9431 Abnormal electrocardiogram [ECG] [EKG]: Secondary | ICD-10-CM | POA: Diagnosis not present

## 2019-04-14 DIAGNOSIS — E782 Mixed hyperlipidemia: Secondary | ICD-10-CM | POA: Diagnosis not present

## 2019-04-21 DIAGNOSIS — I10 Essential (primary) hypertension: Secondary | ICD-10-CM | POA: Diagnosis not present

## 2019-04-21 DIAGNOSIS — E119 Type 2 diabetes mellitus without complications: Secondary | ICD-10-CM | POA: Diagnosis not present

## 2019-04-21 DIAGNOSIS — I160 Hypertensive urgency: Secondary | ICD-10-CM | POA: Diagnosis not present

## 2019-04-21 DIAGNOSIS — E782 Mixed hyperlipidemia: Secondary | ICD-10-CM | POA: Diagnosis not present

## 2019-04-26 ENCOUNTER — Ambulatory Visit: Payer: HMO

## 2019-05-02 ENCOUNTER — Ambulatory Visit: Payer: HMO

## 2019-05-30 ENCOUNTER — Other Ambulatory Visit: Payer: Self-pay | Admitting: Interventional Cardiology

## 2019-06-01 DIAGNOSIS — R011 Cardiac murmur, unspecified: Secondary | ICD-10-CM | POA: Diagnosis not present

## 2019-06-01 DIAGNOSIS — I10 Essential (primary) hypertension: Secondary | ICD-10-CM | POA: Diagnosis not present

## 2019-06-01 DIAGNOSIS — R9431 Abnormal electrocardiogram [ECG] [EKG]: Secondary | ICD-10-CM | POA: Diagnosis not present

## 2019-06-01 DIAGNOSIS — Z733 Stress, not elsewhere classified: Secondary | ICD-10-CM | POA: Diagnosis not present

## 2019-06-01 DIAGNOSIS — E782 Mixed hyperlipidemia: Secondary | ICD-10-CM | POA: Diagnosis not present

## 2019-06-01 DIAGNOSIS — E119 Type 2 diabetes mellitus without complications: Secondary | ICD-10-CM | POA: Diagnosis not present

## 2019-06-01 DIAGNOSIS — G4733 Obstructive sleep apnea (adult) (pediatric): Secondary | ICD-10-CM | POA: Diagnosis not present

## 2019-06-12 ENCOUNTER — Other Ambulatory Visit: Payer: Self-pay

## 2019-06-12 NOTE — Patient Outreach (Signed)
  [facility]) Care Management Chronic Special Needs Program  06/12/2019  Name: [name] [name]. DOB: [birth year]  MRN: [id number]  [name] is enrolled in a chronic special needs plan for Diabetes. Client called with no answer No answer and HIPAA compliant message left. 1st attempt Plan for 2nd outreach call in one week Chronic care management coordinator will attempt outreach in one week.   Dudley Major RN, Maximiano Coss, CDE Chronic Care Management Coordinator [facility] Care Management [phone]

## 2019-06-19 ENCOUNTER — Other Ambulatory Visit: Payer: Self-pay

## 2019-06-19 ENCOUNTER — Encounter: Payer: Self-pay | Admitting: Interventional Cardiology

## 2019-06-19 ENCOUNTER — Ambulatory Visit: Payer: HMO | Admitting: Interventional Cardiology

## 2019-06-19 VITALS — BP 124/56 | HR 62 | Ht 65.0 in | Wt 188.0 lb

## 2019-06-19 DIAGNOSIS — I10 Essential (primary) hypertension: Secondary | ICD-10-CM

## 2019-06-19 DIAGNOSIS — E119 Type 2 diabetes mellitus without complications: Secondary | ICD-10-CM

## 2019-06-19 DIAGNOSIS — G4733 Obstructive sleep apnea (adult) (pediatric): Secondary | ICD-10-CM

## 2019-06-19 DIAGNOSIS — Z7189 Other specified counseling: Secondary | ICD-10-CM | POA: Diagnosis not present

## 2019-06-19 DIAGNOSIS — I700 Atherosclerosis of aorta: Secondary | ICD-10-CM | POA: Diagnosis not present

## 2019-06-19 DIAGNOSIS — R9431 Abnormal electrocardiogram [ECG] [EKG]: Secondary | ICD-10-CM

## 2019-06-19 DIAGNOSIS — E7849 Other hyperlipidemia: Secondary | ICD-10-CM | POA: Diagnosis not present

## 2019-06-19 NOTE — Progress Notes (Signed)
Cardiology Office Note:    Date:  06/19/2019   ID:  [name] Coma., DOB 04-07-1950, MRN [id number]  PCP:  Gwenyth Bender, MD  Cardiologist:  Lesleigh Noe, MD   Referring MD: Gwenyth Bender, MD   Chief Complaint  Patient presents with  . Hyperlipidemia  . Hypertension    History of Present Illness:    [name] Duesing. is a 69 y.o. male with a hx of  a hx of baseline abnormal EKG, negative ischemic evaluations, hypertension, ventricular hypertrophy on echocardiography, hyperlipidemia, type 2 diabetes, and obstructive sleep apnea.  [name] [name] is suffering family stress with his daughters following the death of his wife, [name] [name] who recently passed away with metastatic pancreatic cancer.  He has been stressed concerning that and his professional obligations with the close of tax season within the past week.  He is now on a 3-week hiatus.  He walks his dogs every day.  He denies cardiac complaints.  Past Medical History:  Diagnosis Date  . Abnormal EKG    Continue on Aspirin 81mg , Diagnostic Imaging:EC Stress test midmark (ordered for 03-02-2012)  . Allergic rhinitis   . Diabetes mellitus, type 2 (HCC)   . Hyperlipidemia    stable  . Hypertension   . Obesity, mild    with 6 lbs weight loss  . OSA (obstructive sleep apnea)   . Periodontal disease     Past Surgical History:  Procedure Laterality Date  . WISDOM TOOTH EXTRACTION      Current Medications: Current Meds  Medication Sig  . aspirin 81 MG tablet Take 81 mg by mouth daily.    04-30-2012 diltiazem (CARDIZEM CD) 360 MG 24 hr capsule TAKE (1) CAPSULE DAILY.  [name] glimepiride (AMARYL) 2 MG tablet Take 2 mg by mouth daily.   . hydrochlorothiazide (HYDRODIURIL) 25 MG tablet TAKE 1 TABLET ONCE DAILY.  [name] insulin aspart (NOVOLOG) 100 unit/mL injection Take as directed per sliding scale.  [name] LANTUS 100 UNIT/ML injection   . lisinopril (ZESTRIL) 40 MG tablet TAKE 1 TABLET ONCE DAILY.  . Multiple Vitamin (MULTIVITAMIN) tablet Take 1  tablet by mouth daily.    . simvastatin (ZOCOR) 40 MG tablet TAKE ONE TABLET AT BEDTIME.  . sitaGLIPtin-metformin (JANUMET) 50-1000 MG tablet Take 1 tablet by mouth daily with supper.     Allergies:   Patient has no known allergies.   Social History   Socioeconomic History  . Marital status: Married    Spouse name: Not on file  . Number of children: Not on file  . Years of education: Not on file  . Highest education level: Not on file  Occupational History  . Occupation: Retired    Comment: [name] with IT trainer  Tobacco Use  . Smoking status: Former Smoker    Packs/day: 0.20    Years: 17.00    Pack years: 3.40    Types: Cigarettes    Quit date: 01/27/1983    Years since quitting: 36.4  . Smokeless tobacco: Never Used  Substance and Sexual Activity  . Alcohol use: Yes    Comment: once a month  . Drug use: No  . Sexual activity: Yes  Other Topics Concern  . Not on file  Social History Narrative  . Not on file   Social Determinants of Health   Financial Resource Strain:   . Difficulty of Paying Living Expenses:   Food Insecurity:   . Worried About 03/27/1983 in the Last  Year:   . Ran Out of Food in the Last Year:   Transportation Needs:   . Film/video editor (Medical):   [name] Lack of Transportation (Non-Medical):   Physical Activity:   . Days of Exercise per Week:   . Minutes of Exercise per Session:   Stress:   . Feeling of Stress :   Social Connections:   . Frequency of Communication with Friends and Family:   . Frequency of Social Gatherings with Friends and Family:   . Attends Religious Services:   . Active Member of Clubs or Organizations:   . Attends Archivist Meetings:   [name] Marital Status:      Family History: The patient's family history includes Diabetes in an other family member; Heart attack in his father and mother.  ROS:   Please see the history of present illness.    No orthopnea, PND, or insomnia.  All other  systems reviewed and are negative.  EKGs/Labs/Other Studies Reviewed:    The following studies were reviewed today: No new data  EKG:  EKG not repeated  Recent Labs: No results found for requested labs within last 8760 hours.  Recent Lipid Panel No results found for: CHOL, TRIG, HDL, CHOLHDL, VLDL, LDLCALC, LDLDIRECT  Physical Exam:    VS:  BP (!) 124/56   Pulse 62   Ht 5\' 5"  (1.651 m)   Wt 188 lb (85.3 kg)   SpO2 97%   BMI 31.28 kg/m     Wt Readings from Last 3 Encounters:  06/19/19 188 lb (85.3 kg)  10/28/18 204 lb (92.5 kg)  04/08/18 202 lb 3.2 oz (91.7 kg)     GEN: Moderate obesity. No acute distress HEENT: Normal NECK: No JVD. LYMPHATICS: No lymphadenopathy CARDIAC:  RRR without murmur, gallop, or edema. VASCULAR:  Normal Pulses. No bruits. RESPIRATORY:  Clear to auscultation without rales, wheezing or rhonchi  ABDOMEN: Soft, non-tender, non-distended, No pulsatile mass, MUSCULOSKELETAL: No deformity  SKIN: Warm and dry NEUROLOGIC:  Alert and oriented x 3 PSYCHIATRIC:  Normal affect   ASSESSMENT:    1. Aortic atherosclerosis ([place])   2. Abnormal EKG   3. Essential hypertension   4. Other hyperlipidemia   5. Obstructive sleep apnea   6. Type 2 diabetes mellitus without complication, without long-term current use of insulin ([place])   7. Educated about COVID-19 virus infection    PLAN:    In order of problems listed above:  1. Primary prevention reviewed. 2. Not repeated on today's exam 3. Target 130/80.  Salt restriction and exercise encouraged. 4. LDL target less than 70. 5. Borderline compliant with CPAP. 6. Hemoglobin A1c target is less than 7. 7. He has received the vaccine and is still practicing social distancing.  Overall education and awareness concerning primary risk prevention was discussed in detail: LDL less than 70, hemoglobin A1c less than 7, blood pressure target less than 130/80 mmHg, >150 minutes of moderate aerobic activity per week,  avoidance of smoking, weight control (via diet and exercise), and continued surveillance/management of/for obstructive sleep apnea.    Medication Adjustments/Labs and Tests Ordered: Current medicines are reviewed at length with the patient today.  Concerns regarding medicines are outlined above.  No orders of the defined types were placed in this encounter.  No orders of the defined types were placed in this encounter.   Patient Instructions  Medication Instructions:  Your physician recommends that you continue on your current medications as directed. Please refer to the Current  Medication list given to you today.  *If you need a refill on your cardiac medications before your next appointment, please call your pharmacy*   Lab Work: None If you have labs (blood work) drawn today and your tests are completely normal, you will receive your results only by: [name] MyChart Message (if you have MyChart) OR . A paper copy in the mail If you have any lab test that is abnormal or we need to change your treatment, we will call you to review the results.   Testing/Procedures: None   Follow-Up: At [facility], you and your health needs are our priority.  As part of our continuing mission to provide you with exceptional heart care, we have created designated Provider Care Teams.  These Care Teams include your primary Cardiologist (physician) and Advanced Practice Providers (APPs -  Physician Assistants and Nurse Practitioners) who all work together to provide you with the care you need, when you need it.  We recommend signing up for the patient portal called "MyChart".  Sign up information is provided on this After Visit Summary.  MyChart is used to connect with patients for Virtual Visits (Telemedicine).  Patients are able to view lab/test results, encounter notes, upcoming appointments, etc.  Non-urgent messages can be sent to your provider as well.   To learn more about what you can do with  MyChart, go to [email or web address].    Your next appointment:   6-8 month(s)  The format for your next appointment:   In Person  Provider:   You may see Lesleigh Noe, MD or one of the following Advanced Practice Providers on your designated Care Team:    Norma Fredrickson, NP  Nada Boozer, NP  Georgie Chard, NP    Other Instructions      Signed, Lesleigh Noe, MD  06/19/2019 9:42 AM    [facility]

## 2019-06-19 NOTE — Patient Outreach (Signed)
[place] [facility]) Care Management Chronic Special Needs Program  06/19/2019  Name: [name] [name]. DOB: [birth year]  MRN: [id number]  [name] is enrolled in a chronic special needs plan for  Diabetes.  Client called to complete health risk assessment and update individualized care plan  2nd attempt Client answered and HIPAA verified.  States that he does not wish to speak with anyone at this time about his health or to complete a health risk assessment.  States he will call if he needs anything A completed 2021 health risk assessment has not been received from the client and client declines completing  [place] at this time  The client's individualized care plan was developed based on available data and previous [place]            This Visit's Progress   . Client understands the importance of follow-up with providers by attending scheduled visits   On track    Plan to keep scheduled appointments with providers    . Client will use Assistive Devices as needed and verbalize understanding of device use   On track    [employer] approved meters-One Touch, Freestyle or Precision     . Client will verbalize knowledge of self management of Hypertension as evidences by BP reading of 140/90 or less; or as defined by provider   On track    B/P below 140/90 at provider visit Plan to check B/P regularly Take B/P medications as ordered Plan to follow a low salt diet  Increase activity as tolerated Sent EMMI: High Blood Pressure (Hypertension) What you can do. Please call [phone] for any questions    . HEMOGLOBIN A1C < 7.0       Last Hemoglobin A1C less than 7% per report to cardiologist  06/19/19 Plan to check blood sugars as directed with goals fasting or 1 1/2 hours after eating with goal of 80-130 fasting and 180 or less after meals Plan to follow a low carbohydrate, low salt diet, watch portion sizes  and avoid sugar sweetened drinks Plan to exercise 150 minutes a week including two sessions of resistance exercise weekly Plan to review [employer] calendar sent in the mail for Diabetes Action Plan    . COMPLETED: Maintain timely refills of diabetic medication as prescribed within the year .   On track    Maintaining timely refills of medications per dispense report It is important to get your medications refilled on time Goal completed 06/19/19    . Obtain annual  Lipid Profile, LDL-C       Last completed 02/21/18 LDL 47 The goal for LDL is less than 70 mg/dL as you are at high risk for complications Try to avoid saturated fats, trans-fats and eat more fiber Plan to take statin as ordered    . Obtain Annual Eye (retinal)  Exam    Not on track    Plan to have a dilated eye exam every year    . Obtain Annual Foot Exam   Not on track    Check your skin and feet every day for cuts, bruises, redness, blisters, or sores. Report to provider any problems with your feet Schedule a foot exam with your health care provider once every year    . Obtain annual screen for micro albuminuria (urine) , nephropathy (kidney problems)   Not on track    It is important for your doctor to check your  urine for protein at least every year    . Obtain Hemoglobin A1C at least 2 times per year   Not on track    Last seen completed 02/21/18  It is important to have your Hemoglobin A1C checked every 6 months if you are at goal and every 3 months if you are not at goal    . Visit Primary Care Provider or Endocrinologist at least 2 times per year    On track    Completed  05/27/19  Please schedule your annual wellness visit       Client is  meeting diabetes self management goal of hemoglobin A1C of <7% with last reading Client declines to speak with RNCM about his health.  Client does agree to [facility] sending his updated individualized care plan and he will contact RNCM if need.  States RNCM can try calling  back at the end of the year Plan:  . Send unsuccessful outreach letter with a copy of individualized care plan to client . Send individualized care plan to provider . Educational Materials-EMMI-High Blood Pressure(Hypertension): What you can do  Chronic care management coordinator will attempt outreach in 6 months.  Dudley Major RN, Maximiano Coss, CDE Chronic Care Management Coordinator [facility] Care Management [phone]

## 2019-06-19 NOTE — Patient Instructions (Signed)
Medication Instructions:  Your physician recommends that you continue on your current medications as directed. Please refer to the Current Medication list given to you today.  *If you need a refill on your cardiac medications before your next appointment, please call your pharmacy*   Lab Work: None If you have labs (blood work) drawn today and your tests are completely normal, you will receive your results only by: . MyChart Message (if you have MyChart) OR . A paper copy in the mail If you have any lab test that is abnormal or we need to change your treatment, we will call you to review the results.   Testing/Procedures: None   Follow-Up: At [facility], you and your health needs are our priority.  As part of our continuing mission to provide you with exceptional heart care, we have created designated Provider Care Teams.  These Care Teams include your primary Cardiologist (physician) and Advanced Practice Providers (APPs -  Physician Assistants and Nurse Practitioners) who all work together to provide you with the care you need, when you need it.  We recommend signing up for the patient portal called "MyChart".  Sign up information is provided on this After Visit Summary.  MyChart is used to connect with patients for Virtual Visits (Telemedicine).  Patients are able to view lab/test results, encounter notes, upcoming appointments, etc.  Non-urgent messages can be sent to your provider as well.   To learn more about what you can do with MyChart, go to [email or web address].    Your next appointment:   6-8 month(s)  The format for your next appointment:   In Person  Provider:   You may see Henry W Smith III, MD or one of the following Advanced Practice Providers on your designated Care Team:    Lori Gerhardt, NP  Laura Ingold, NP  Jill McDaniel, NP    Other Instructions   

## 2019-07-21 DIAGNOSIS — E1165 Type 2 diabetes mellitus with hyperglycemia: Secondary | ICD-10-CM | POA: Diagnosis not present

## 2019-07-21 DIAGNOSIS — Z125 Encounter for screening for malignant neoplasm of prostate: Secondary | ICD-10-CM | POA: Diagnosis not present

## 2019-07-21 DIAGNOSIS — I10 Essential (primary) hypertension: Secondary | ICD-10-CM | POA: Diagnosis not present

## 2019-07-21 DIAGNOSIS — Z6831 Body mass index (BMI) 31.0-31.9, adult: Secondary | ICD-10-CM | POA: Diagnosis not present

## 2019-07-21 DIAGNOSIS — G4733 Obstructive sleep apnea (adult) (pediatric): Secondary | ICD-10-CM | POA: Diagnosis not present

## 2019-07-21 DIAGNOSIS — R9431 Abnormal electrocardiogram [ECG] [EKG]: Secondary | ICD-10-CM | POA: Diagnosis not present

## 2019-07-21 DIAGNOSIS — Z0001 Encounter for general adult medical examination with abnormal findings: Secondary | ICD-10-CM | POA: Diagnosis not present

## 2019-07-21 DIAGNOSIS — Z733 Stress, not elsewhere classified: Secondary | ICD-10-CM | POA: Diagnosis not present

## 2019-07-21 DIAGNOSIS — E782 Mixed hyperlipidemia: Secondary | ICD-10-CM | POA: Diagnosis not present

## 2019-07-21 DIAGNOSIS — R195 Other fecal abnormalities: Secondary | ICD-10-CM | POA: Diagnosis not present

## 2019-07-29 IMAGING — CT CT HEART SCORING
2 series · 16 of 20 positions shown, 18 images · non-contrast
Comparison: None.

Addendum:
EXAM:
OVER-READ INTERPRETATION  CT CHEST

The following report is an over-read performed by radiologist Dr.
Lester Gilbertson [REDACTED] on 03/04/2018. This
over-read does not include interpretation of cardiac or coronary
anatomy or pathology. The coronary calcium score interpretation by
the cardiologist is attached.
CLINICAL DATA: Risk stratification
Coronary Calcium Score
TECHNIQUE: The patient was scanned on a Siemens Somatom 64 slice scanner. Axial
non-contrast 3 mm slices were carried out through the heart. The
data set was analyzed on a dedicated work station and scored using
the Agatson method.

[Series 3: casc 3.0 i36f 2 bestdiast 70 % · axial · 0.34mm/px · z∈[-277,-169]mm · 8 of 48 slices shown, 10 images]
[im 6/48  vessel]
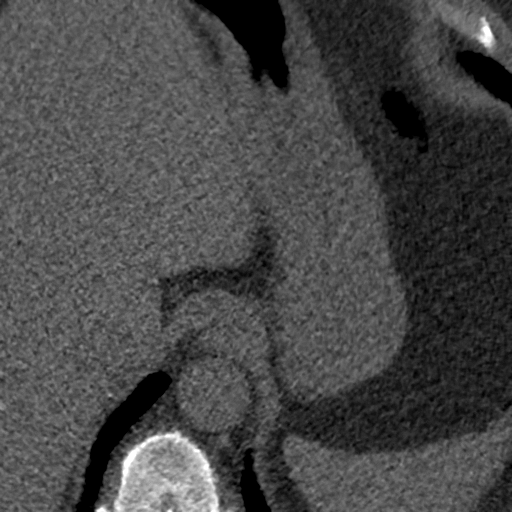
[im 6/48  lung]
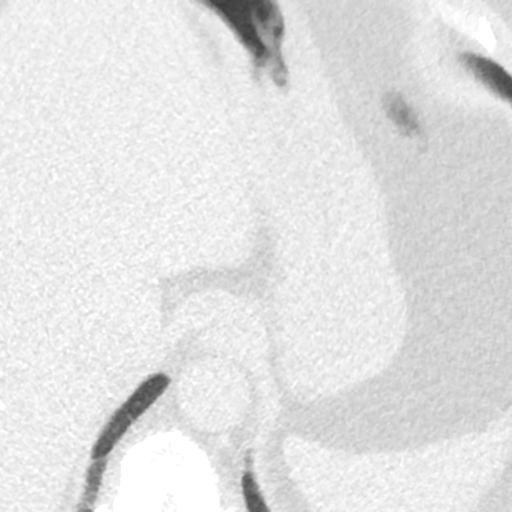
[im 11/48  vessel]
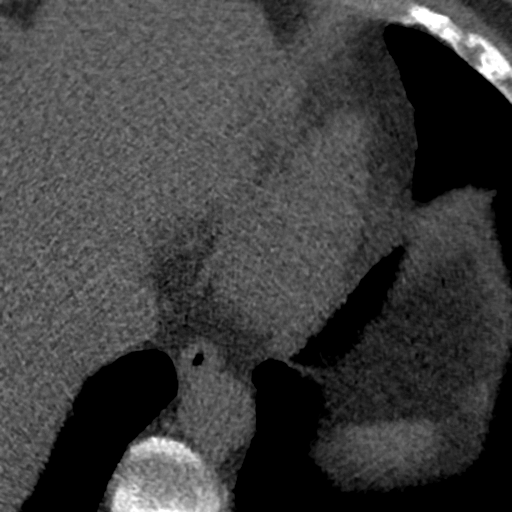
[im 16/48  vessel]
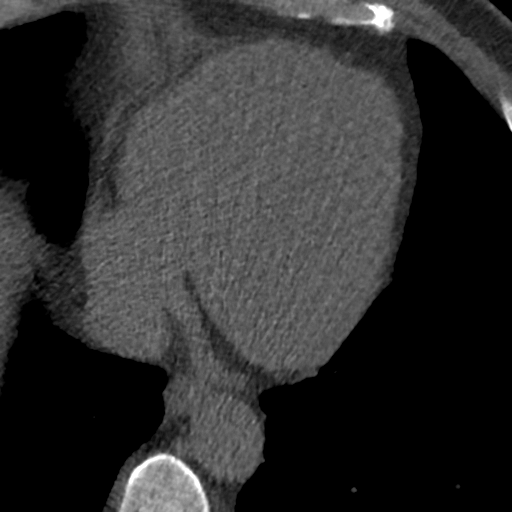
[im 21/48  vessel]
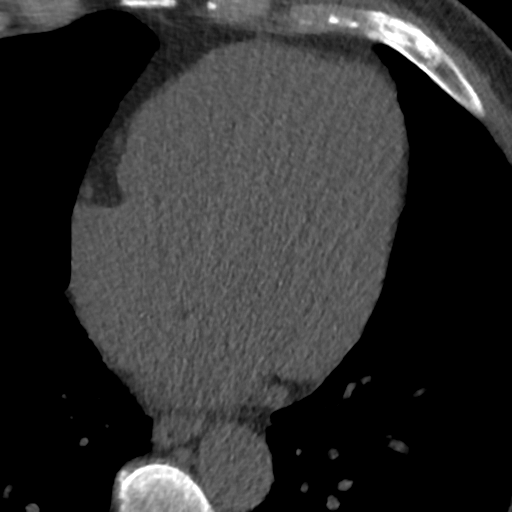
[im 27/48  vessel]
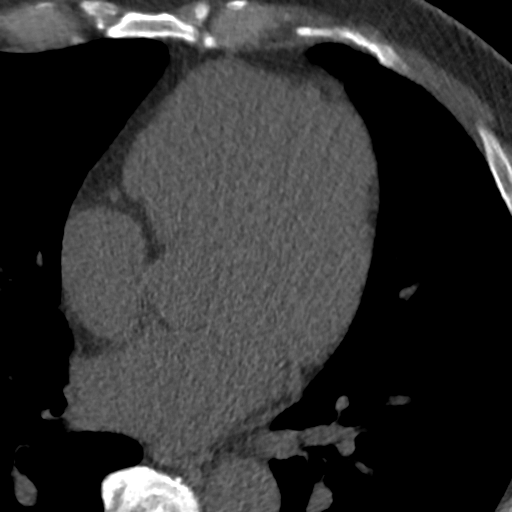
[im 27/48  lung]
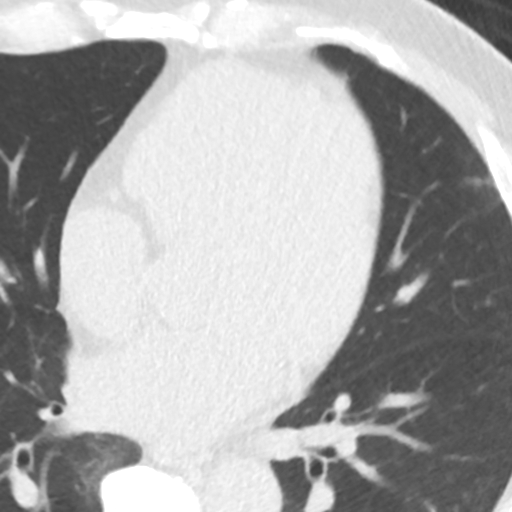
[im 32/48  vessel]
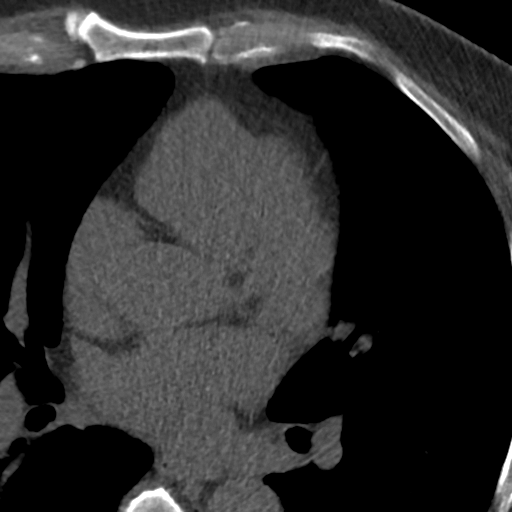
[im 37/48  vessel]
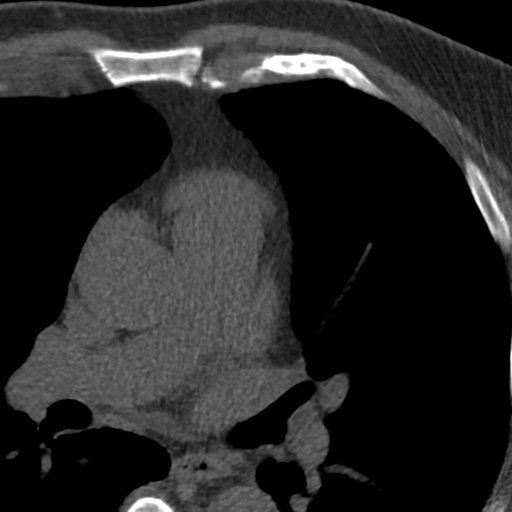
[im 42/48  vessel]
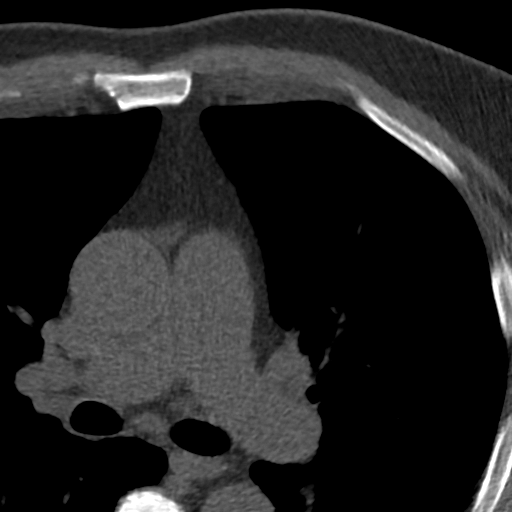

[Series 5: lung st 70 % · axial · 0.68mm/px · z∈[-277,-169]mm · 8 of 48 slices shown]
[im 6/48  lung]
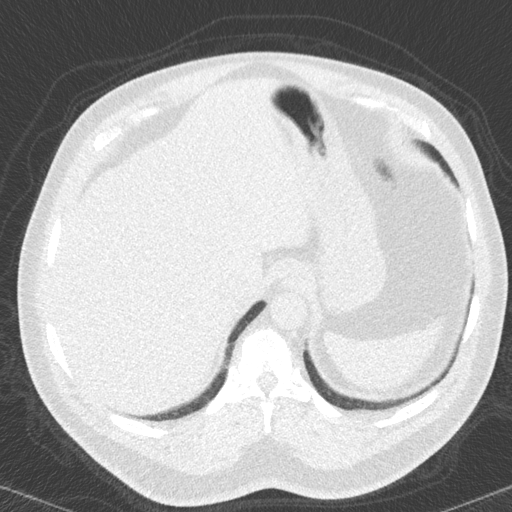
[im 11/48  lung]
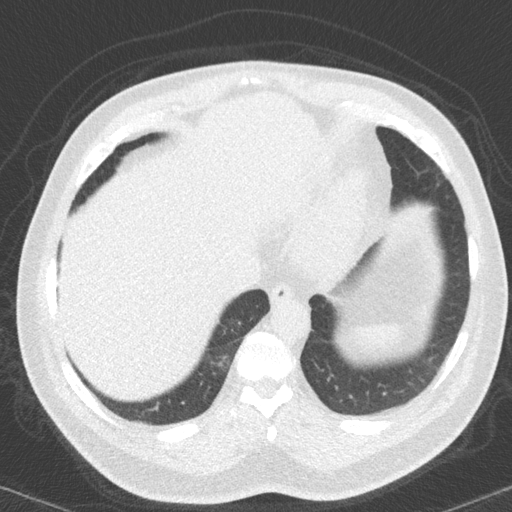
[im 16/48  lung]
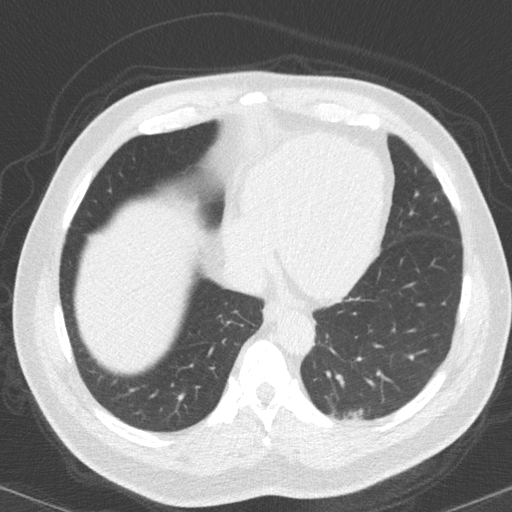
[im 21/48  lung]
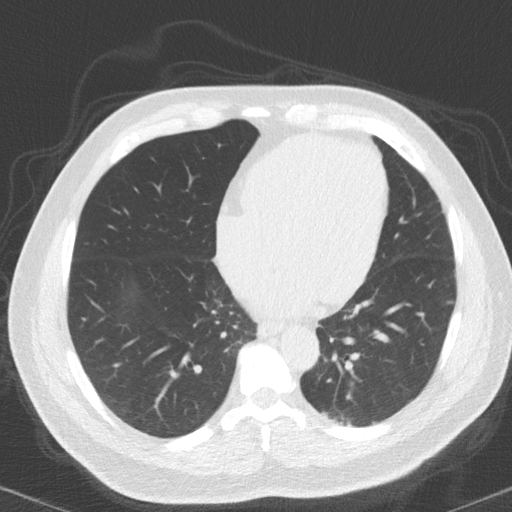
[im 27/48  lung]
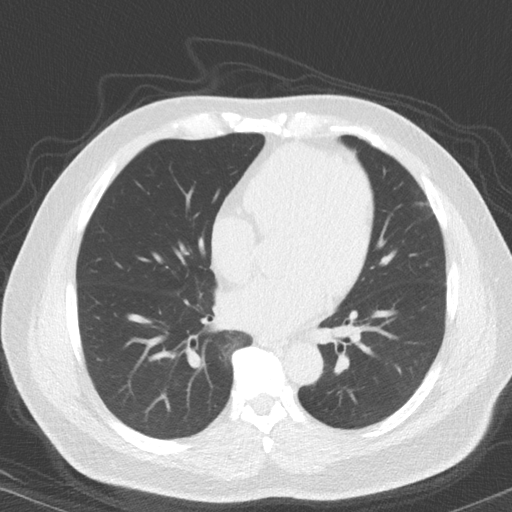
[im 32/48  lung]
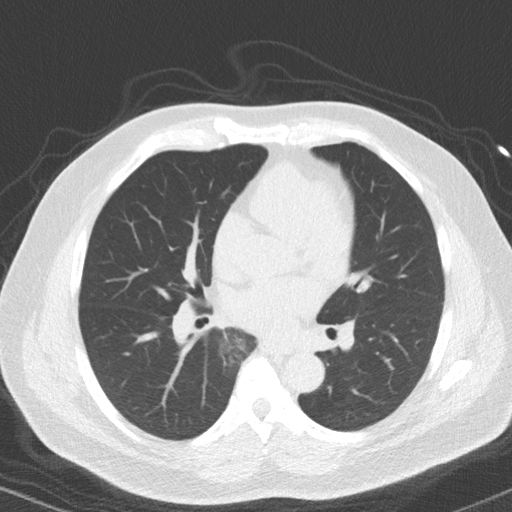
[im 37/48  lung]
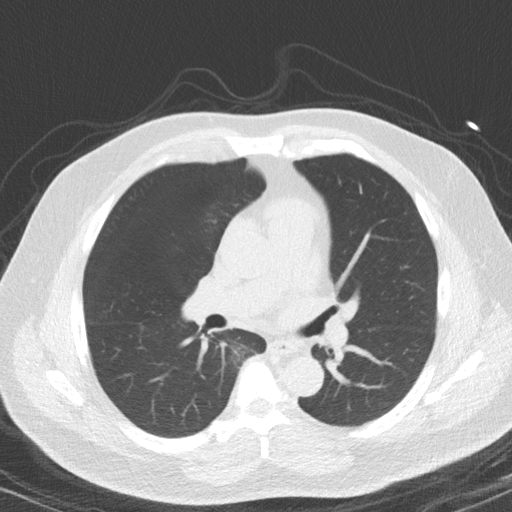
[im 42/48  lung]
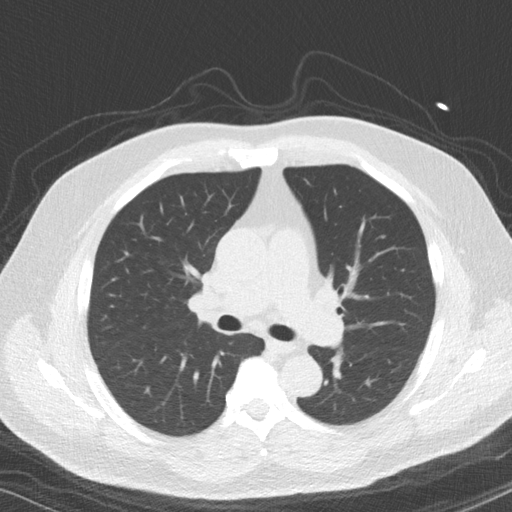

[16 of 20 positions shown; findings below may reference images not displayed]

FINDINGS: Limited view of the lung parenchyma demonstrates no suspicious
nodularity. Airways are normal.

Limited view of the mediastinum demonstrates no adenopathy.
Esophagus normal.

Limited view of the upper abdomen unremarkable.

Limited view of the skeleton and chest wall is unremarkable.
IMPRESSION: No significant extracardiac findings
FINDINGS: Non-cardiac: See separate report from [REDACTED].

Ascending aorta: Some calcific atherosclerosis normal diameter
cm Some aortic valve calcification

Pericardium: Normal

Coronary arteries: No coronary calcium noted
IMPRESSION: Coronary calcium score of 0.

Some calcific aortic valve disease and atherosclerosis of the aortic
Cortis Moto

Lorelaie Dingli

*** End of Addendum ***

## 2019-08-07 DIAGNOSIS — E1165 Type 2 diabetes mellitus with hyperglycemia: Secondary | ICD-10-CM | POA: Diagnosis not present

## 2019-08-07 DIAGNOSIS — G4733 Obstructive sleep apnea (adult) (pediatric): Secondary | ICD-10-CM | POA: Diagnosis not present

## 2019-08-07 DIAGNOSIS — I160 Hypertensive urgency: Secondary | ICD-10-CM | POA: Diagnosis not present

## 2019-08-07 DIAGNOSIS — E782 Mixed hyperlipidemia: Secondary | ICD-10-CM | POA: Diagnosis not present

## 2019-08-07 DIAGNOSIS — I10 Essential (primary) hypertension: Secondary | ICD-10-CM | POA: Diagnosis not present

## 2019-08-18 DIAGNOSIS — I10 Essential (primary) hypertension: Secondary | ICD-10-CM | POA: Diagnosis not present

## 2019-08-18 DIAGNOSIS — I160 Hypertensive urgency: Secondary | ICD-10-CM | POA: Diagnosis not present

## 2019-08-18 DIAGNOSIS — E1165 Type 2 diabetes mellitus with hyperglycemia: Secondary | ICD-10-CM | POA: Diagnosis not present

## 2019-08-18 DIAGNOSIS — E782 Mixed hyperlipidemia: Secondary | ICD-10-CM | POA: Diagnosis not present

## 2019-08-18 DIAGNOSIS — Z6831 Body mass index (BMI) 31.0-31.9, adult: Secondary | ICD-10-CM | POA: Diagnosis not present

## 2019-10-03 DIAGNOSIS — E119 Type 2 diabetes mellitus without complications: Secondary | ICD-10-CM | POA: Diagnosis not present

## 2019-10-03 DIAGNOSIS — G4733 Obstructive sleep apnea (adult) (pediatric): Secondary | ICD-10-CM | POA: Diagnosis not present

## 2019-10-03 DIAGNOSIS — I10 Essential (primary) hypertension: Secondary | ICD-10-CM | POA: Diagnosis not present

## 2019-10-03 DIAGNOSIS — Z733 Stress, not elsewhere classified: Secondary | ICD-10-CM | POA: Diagnosis not present

## 2019-10-03 DIAGNOSIS — E782 Mixed hyperlipidemia: Secondary | ICD-10-CM | POA: Diagnosis not present

## 2019-10-03 DIAGNOSIS — I160 Hypertensive urgency: Secondary | ICD-10-CM | POA: Diagnosis not present

## 2019-11-17 ENCOUNTER — Other Ambulatory Visit: Payer: Self-pay | Admitting: Interventional Cardiology

## 2019-12-01 ENCOUNTER — Other Ambulatory Visit: Payer: Self-pay | Admitting: Interventional Cardiology

## 2019-12-04 ENCOUNTER — Other Ambulatory Visit: Payer: Self-pay

## 2019-12-04 ENCOUNTER — Other Ambulatory Visit: Payer: Self-pay | Admitting: Interventional Cardiology

## 2019-12-04 NOTE — Patient Outreach (Signed)
  [facility]) Care Management Chronic Special Needs Program  12/04/2019  Name: [name] [name]. DOB: [birth year]  MRN: [id number]  [name] is enrolled in a chronic special needs plan for Diabetes. Client called with no answer No answer and HIPAA compliant message left. 1st attempt Plan for 2nd outreach call in one week Chronic care management coordinator will attempt outreach in one week.   Dudley Major RN, Maximiano Coss, CDE Chronic Care Management Coordinator [facility] Management [phone]

## 2019-12-05 ENCOUNTER — Other Ambulatory Visit: Payer: Self-pay | Admitting: Interventional Cardiology

## 2019-12-11 ENCOUNTER — Other Ambulatory Visit: Payer: Self-pay

## 2019-12-11 NOTE — Patient Outreach (Signed)
  [facility]) Care Management Chronic Special Needs Program  12/11/2019  Name: [name] [name]. DOB: 19-Jul-1950  MRN: [id number]  [name] is enrolled in a chronic special needs plan for Diabetes. Client called with no answer No answer and HIPAA compliant message left. 2nd attempt Plan for 3rd outreach call in 3-4 days Chronic care management coordinator will attempt outreach in 3-4 days.   Dudley Major RN, Maximiano Coss, CDE Chronic Care Management Coordinator [facility] Management [phone]

## 2019-12-15 ENCOUNTER — Other Ambulatory Visit: Payer: Self-pay

## 2019-12-15 NOTE — Patient Outreach (Signed)
[facility]) Care Management Chronic Special Needs Program  12/15/2019  Name: Sahaj Bona. DOB: 10/17/1950  MRN: [id number]  Mr. Raeden Schippers is enrolled in a chronic special needs plan for  Diabetes.  Client called with no answer and HIPAA compliant message left.  3rd attempt A completed 2021 health risk assessment has not been received from the client and client has not responded to 3 outreach attempts by [facility] to complete 2021 Health Risk Assessment  The client's individualized care plan was developed based on available data and previous 2020 Health Risk Assessment Goals Addressed            This Visit's Progress   . Client understands the importance of follow-up with providers by attending scheduled visits   On track    Plan to keep scheduled appointments with providers    . COMPLETED: Client will use Assistive Devices as needed and verbalize understanding of device use   On track    Using [employer] approved meter-One Touch per records Goal completed 12/15/19    . Client will verbalize knowledge of self management of Hypertension as evidences by BP reading of 140/90 or less; or as defined by provider   On track    B/P below 140/90 at provider visit 126/56 at 06/19/19 appointment Plan to check B/P regularly Take B/P medications as ordered Plan to follow a low salt diet  Increase activity as tolerated    . HEMOGLOBIN A1C < 7        Last Hemoglobin A1C less than 7% per report to cardiologist  06/19/19 Plan to check blood sugars as directed with goals fasting or 1 1/2 hours after eating with goal of 80-130 fasting and 180 or less after meals Plan to follow a low carbohydrate, low salt diet, watch portion sizes and avoid sugar sweetened drinks Plan to exercise 150 minutes a week including two sessions of resistance exercise weekly Plan to review [employer] calendar sent in the mail for Diabetes Action Plan    . Obtain annual  Lipid  Profile, LDL-C   On track    Last completed 02/21/18 LDL 47 The goal for LDL is less than 70 mg/dL as you are at high risk for complications Try to avoid saturated fats, trans-fats and eat more fiber Plan to take statin as ordered    . Obtain Annual Eye (retinal)  Exam    Not on track    Plan to have a dilated eye exam every year    . Obtain Annual Foot Exam   Not on track    Check your skin and feet every day for cuts, bruises, redness, blisters, or sores. Report to provider any problems with your feet Schedule a foot exam with your health care provider once every year    . Obtain annual screen for micro albuminuria (urine) , nephropathy (kidney problems)   Not on track    It is important for your doctor to check your urine for protein at least every year    . Obtain Hemoglobin A1C at least 2 times per year   Not on track    Last seen completed 02/21/18  It is important to have your Hemoglobin A1C checked every 6 months if you are at goal and every 3 months if you are not at goal    . COMPLETED: Visit Primary Care Provider or Endocrinologist at least 2 times per year    On track    Completed  05/27/19, 08/18/19 Cardiology 06/19/19 Please schedule your annual wellness visit Goal completed 12/15/19        Plan:  . Send unsuccessful outreach letter with a copy of individualized care plan to client . Send individualized care plan to provider   Client will be outreached by a [employer]) RNCM in 6 months per tier level [facility] Care Management will continue to provide services for this client through 01/26/2020. The [employer] care management team will assume care 01/27/2020.   Dudley Major RN, Maximiano Coss, CDE Chronic Care Management Coordinator [facility] Management [phone]

## 2019-12-26 ENCOUNTER — Ambulatory Visit: Payer: HMO | Attending: Internal Medicine

## 2019-12-26 DIAGNOSIS — Z23 Encounter for immunization: Secondary | ICD-10-CM

## 2019-12-26 NOTE — Progress Notes (Signed)
   [id number]  Name:  Ebrima Ranta.    MRN: [id number] DOB: April 11, 1950  12/26/2019  Mr. Voorhies was observed post Covid-19 immunization for 15 minutes without incident. He was provided with Vaccine Information Sheet and instruction to access the V-Safe system.   Mr. Stemm was instructed to call 911 with any severe reactions post vaccine: [name] Difficulty breathing  . Swelling of face and throat  . A fast heartbeat  . A bad rash all over body  . Dizziness and weakness   Immunizations Administered    Name Date Dose VIS Date Route   Pfizer COVID-19 Vaccine 12/26/2019  1:09 PM 0.3 mL 11/15/2019 Intramuscular   Manufacturer: [employer], [employer]   Lot: [id number]   NDC: [address]-7

## 2019-12-29 DIAGNOSIS — I10 Essential (primary) hypertension: Secondary | ICD-10-CM | POA: Diagnosis not present

## 2019-12-29 DIAGNOSIS — E119 Type 2 diabetes mellitus without complications: Secondary | ICD-10-CM | POA: Diagnosis not present

## 2019-12-29 DIAGNOSIS — E782 Mixed hyperlipidemia: Secondary | ICD-10-CM | POA: Diagnosis not present

## 2019-12-29 DIAGNOSIS — Z6829 Body mass index (BMI) 29.0-29.9, adult: Secondary | ICD-10-CM | POA: Diagnosis not present

## 2019-12-29 DIAGNOSIS — R9431 Abnormal electrocardiogram [ECG] [EKG]: Secondary | ICD-10-CM | POA: Diagnosis not present

## 2019-12-29 DIAGNOSIS — Z733 Stress, not elsewhere classified: Secondary | ICD-10-CM | POA: Diagnosis not present

## 2019-12-29 DIAGNOSIS — G4733 Obstructive sleep apnea (adult) (pediatric): Secondary | ICD-10-CM | POA: Diagnosis not present

## 2020-01-02 ENCOUNTER — Other Ambulatory Visit: Payer: Self-pay

## 2020-01-02 NOTE — Patient Outreach (Signed)
  [facility]) Care Management Chronic Special Needs Program    01/02/2020  Name: [name] Tegtmeyer., DOB: 1950-09-29  MRN: [id number]   [name] is enrolled in a chronic special needs plan for Diabetes.  [facility] Management will continue to provide services for this client through 01/26/2020. The [employer] care management team will assume care 01/27/2020.   Dudley Major RN, Maximiano Coss, CDE Chronic Care Management Coordinator [facility] [phone]

## 2020-01-30 ENCOUNTER — Other Ambulatory Visit: Payer: Self-pay

## 2020-02-15 ENCOUNTER — Other Ambulatory Visit: Payer: Self-pay | Admitting: Interventional Cardiology

## 2020-03-06 ENCOUNTER — Other Ambulatory Visit: Payer: Self-pay | Admitting: Interventional Cardiology

## 2020-03-22 ENCOUNTER — Other Ambulatory Visit: Payer: Self-pay | Admitting: Interventional Cardiology

## 2020-04-12 DIAGNOSIS — Z6829 Body mass index (BMI) 29.0-29.9, adult: Secondary | ICD-10-CM | POA: Diagnosis not present

## 2020-04-12 DIAGNOSIS — I10 Essential (primary) hypertension: Secondary | ICD-10-CM | POA: Diagnosis not present

## 2020-04-12 DIAGNOSIS — G4733 Obstructive sleep apnea (adult) (pediatric): Secondary | ICD-10-CM | POA: Diagnosis not present

## 2020-04-12 DIAGNOSIS — E119 Type 2 diabetes mellitus without complications: Secondary | ICD-10-CM | POA: Diagnosis not present

## 2020-04-12 DIAGNOSIS — E782 Mixed hyperlipidemia: Secondary | ICD-10-CM | POA: Diagnosis not present

## 2020-04-12 DIAGNOSIS — Z733 Stress, not elsewhere classified: Secondary | ICD-10-CM | POA: Diagnosis not present

## 2020-06-07 ENCOUNTER — Other Ambulatory Visit: Payer: Self-pay | Admitting: Interventional Cardiology

## 2020-06-22 ENCOUNTER — Other Ambulatory Visit: Payer: Self-pay | Admitting: Interventional Cardiology

## 2020-06-26 ENCOUNTER — Telehealth: Payer: Self-pay | Admitting: Interventional Cardiology

## 2020-06-26 MED ORDER — LISINOPRIL 40 MG PO TABS
40.0000 mg | ORAL_TABLET | Freq: Every day | ORAL | 1 refills | Status: DC
Start: 2020-06-26 — End: 2020-09-06

## 2020-06-26 NOTE — Telephone Encounter (Signed)
Pt's medication was sent to pt's pharmacy as requested. Confirmation received.  °

## 2020-06-26 NOTE — Telephone Encounter (Signed)
*  STAT* If patient is at the pharmacy, call can be transferred to refill team.   1. Which medications need to be refilled? (please list name of each medication and dose if known) Lisinopril  2. Which pharmacy/location (including street and city if local pharmacy) is medication to be sent to? [facility]  3. Do they need a 30 day or 90 day supply? Need enough until  His appt 07-30-20

## 2020-07-07 ENCOUNTER — Other Ambulatory Visit: Payer: Self-pay | Admitting: Interventional Cardiology

## 2020-07-19 DIAGNOSIS — G4733 Obstructive sleep apnea (adult) (pediatric): Secondary | ICD-10-CM | POA: Diagnosis not present

## 2020-07-19 DIAGNOSIS — R9431 Abnormal electrocardiogram [ECG] [EKG]: Secondary | ICD-10-CM | POA: Diagnosis not present

## 2020-07-19 DIAGNOSIS — E119 Type 2 diabetes mellitus without complications: Secondary | ICD-10-CM | POA: Diagnosis not present

## 2020-07-19 DIAGNOSIS — Z733 Stress, not elsewhere classified: Secondary | ICD-10-CM | POA: Diagnosis not present

## 2020-07-19 DIAGNOSIS — I10 Essential (primary) hypertension: Secondary | ICD-10-CM | POA: Diagnosis not present

## 2020-07-19 DIAGNOSIS — E782 Mixed hyperlipidemia: Secondary | ICD-10-CM | POA: Diagnosis not present

## 2020-07-19 DIAGNOSIS — Z6830 Body mass index (BMI) 30.0-30.9, adult: Secondary | ICD-10-CM | POA: Diagnosis not present

## 2020-07-19 DIAGNOSIS — J301 Allergic rhinitis due to pollen: Secondary | ICD-10-CM | POA: Diagnosis not present

## 2020-07-23 DIAGNOSIS — E782 Mixed hyperlipidemia: Secondary | ICD-10-CM | POA: Diagnosis not present

## 2020-07-23 DIAGNOSIS — Z125 Encounter for screening for malignant neoplasm of prostate: Secondary | ICD-10-CM | POA: Diagnosis not present

## 2020-07-23 DIAGNOSIS — E119 Type 2 diabetes mellitus without complications: Secondary | ICD-10-CM | POA: Diagnosis not present

## 2020-07-23 DIAGNOSIS — I10 Essential (primary) hypertension: Secondary | ICD-10-CM | POA: Diagnosis not present

## 2020-07-26 NOTE — Progress Notes (Signed)
Cardiology Office Note   Date:  07/30/2020   ID:  [name] Coma., DOB 1950/12/15, MRN [id number]  PCP:  Gwenyth Bender, MD  Cardiologist:  Dr. Katrinka Blazing, MD   Chief Complaint  Patient presents with   Follow-up     History of Present Illness: [name] Rabalais. is a 70 y.o. male who presents for follow up, seen for Dr. Katrinka Blazing.   [name] [name] has a hx of baseline abnormal EKG with multiple negative ischemic evaluations, HTN, LVH on echo, HLD, DM2 and OSA.   She was most recently seen in follow up 06/19/2019 with Dr. Katrinka Blazing at which time she was having lots of family stress with the death of his wife.   Today he is here and reports that he is feeling good with no specific CV complaints. He continues to be under a lot of stress. 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 EKG today shows a HR at 44bpm. He denies dizziness, chest pain or syncope. He was placed on bystolic 10mg  by his PCP for hypertension, likely the culprit of his bradycardia given that he is also on diltiazem 360mg . He recently had lab work at his PCP last week however I cannot see this   Past Medical History:  Diagnosis Date   Abnormal EKG    Continue on Aspirin 81mg , Diagnostic Imaging:EC Stress test midmark (ordered for 03-02-2012)   Allergic rhinitis    Diabetes mellitus, type 2 (HCC)    Hyperlipidemia    stable   Hypertension    Obesity, mild    with 6 lbs weight loss   OSA (obstructive sleep apnea)    Periodontal disease     Past Surgical History:  Procedure Laterality Date   WISDOM TOOTH EXTRACTION       Current Outpatient Medications  Medication Sig Dispense Refill   aspirin 81 MG tablet Take 81 mg by mouth daily.       BD INSULIN SYRINGE U/F 30G X 1/2" 0.5 ML MISC 2 (two) times daily. as directed     diltiazem (CARDIZEM CD) 360 MG 24 hr capsule TAKE (1) CAPSULE DAILY. 90 capsule 2   glimepiride (AMARYL) 2 MG tablet Take 2 mg by mouth daily.      hydrochlorothiazide (HYDRODIURIL) 25 MG  tablet TAKE ONE TABLET BY MOUTH ONE DAILY MUST MAKE AN APPOINTMENT FOR FUTURE FILLS 30 tablet 11   insulin aspart (NOVOLOG) 100 unit/mL injection Take as directed per sliding scale.     LANTUS 100 UNIT/ML injection 32 Units.     lisinopril (ZESTRIL) 40 MG tablet Take 1 tablet (40 mg total) by mouth daily. Please keep upcoming appt with Dr. in July 2022 before anymore refills. Thank you 30 tablet 1   Multiple Vitamin (MULTIVITAMIN) tablet Take 1 tablet by mouth daily.       simvastatin (ZOCOR) 40 MG tablet Take 1 tablet (40 mg total) by mouth at bedtime. Please make yearly appt with Dr. 04-30-2012 for May 2022 for future refills. Thank you 1st attempt 90 tablet 1   sitaGLIPtin-metformin (JANUMET) 50-1000 MG tablet Take 1 tablet by mouth daily with supper.     No current facility-administered medications for this visit.    Allergies:   Patient has no known allergies.    Social History:  The patient  reports that he quit smoking about 37 years ago. His smoking use included cigarettes. He has a 3.40 pack-year smoking history. He has  never used smokeless tobacco. He reports current alcohol use. He reports that he does not use drugs.   Family History:  The patient's family history includes Diabetes in an other family member; Heart attack in his father and mother.    ROS:  Please see the history of present illness.  Otherwise, review of systems are positive for none.   All other systems are reviewed and negative.    PHYSICAL EXAM: VS:  BP 112/60   Pulse (!) 44   Ht 5\' 6"  (1.676 m)   Wt 192 lb (87.1 kg)   SpO2 96%   BMI 30.99 kg/m  , BMI Body mass index is 30.99 kg/m.  General: Well developed, well nourished, NAD Neck: Negative for carotid bruits. No JVD Lungs:Clear to ausculation bilaterally. No wheezes, rales, or rhonchi. Breathing is unlabored. Cardiovascular: RRR with S1 S2. No murmurs Extremities: No edema. Neuro: Alert and oriented. No focal deficits. No facial asymmetry. MAE  spontaneously. Psych: Responds to questions appropriately with normal affect.     EKG:  EKG is ordered today. The ekg ordered today demonstrates SB with HR 44bpm, TWI in leads II, aVF, V4-6>>similar to prior tracing from 2020.    Recent Labs: No results found for requested labs within last 8760 hours.    Lipid Panel No results found for: CHOL, TRIG, HDL, CHOLHDL, VLDL, LDLCALC, LDLDIRECT    Wt Readings from Last 3 Encounters:  07/30/20 192 lb (87.1 kg)  06/19/19 188 lb (85.3 kg)  10/28/18 204 lb (92.5 kg)    Other studies Reviewed: Additional studies/ records that were reviewed today include: . Review of the above records demonstrates:   Echocardiogram 03/04/2018:   1. The left ventricle has hyperdynamic systolic function of >65%. The  cavity size was normal. There is mildly increased left ventricular wall  thickness. Echo evidence of impaired diastolic relaxation.   2. The right ventricle has normal systolic function. The cavity was  normal. There is no increase in right ventricular wall thickness.   3. Left atrial size was moderately dilated.   4. The mitral valve is normal in structure.   5. The tricuspid valve is normal in structure.   6. The aortic valve is tricuspid There is mild thickening of the aortic  valve.   7. The pulmonic valve was normal in structure.   8. The interatrial septum is aneurysmal.   9. Vigorous LV systolic function; mild diastolic dysfunction; mild LVH;  elevated LVOT velocity of 2.5 m/s likely related to vigorous LV function;  aortic valve opens well and no SAM noted; moderate LAE.   ETT 03/04/2018:  Blood pressure demonstrated a hypertensive response to exercise. Clinically and electrically negative for ischemia    ASSESSMENT AND PLAN:  1. Aortic atherosclerosis: -Continue with primary prevention with adequate BP and lipids  -Continue current regimen   2. HTN with bradycardia: -Soft, 112/60  -Pt placed on bystolic 10mg  per PCP and has  additionally been on diltiazem 360mg  QD -Stop bystolic and continue diltiazem. Will follow closely for follow up after wash out however suspect this is the culprit of his bradycardia and soft BPS -Asymptomatic, no dizziness or syncope -Obtain BMET  3. HLD: -Continue statin -Obtain lipid panel prior to next follow up   4. OSA: -Compliant with PCP   5. DM2: -Follows with PCP -On insulin     Current medicines are reviewed at length with the patient today.  The patient does not have concerns regarding medicines.  The following changes have  been made:  stop bystolic   Labs/ tests ordered today include: BMET, CBC, lipid panel   Orders Placed This Encounter  Procedures   Basic Metabolic Panel (BMET)   CBC   Lipid panel   EKG 12-Lead    Disposition:   FU with APP in 3 weeks  Signed, Georgie Chard, NP  07/30/2020 12:35 PM    [facility] [address] [place], [place], [place]  [address] Phone: [phone]; Fax: [phone]

## 2020-07-30 ENCOUNTER — Encounter: Payer: Self-pay | Admitting: Cardiology

## 2020-07-30 ENCOUNTER — Ambulatory Visit (INDEPENDENT_AMBULATORY_CARE_PROVIDER_SITE_OTHER): Payer: HMO | Admitting: Cardiology

## 2020-07-30 ENCOUNTER — Other Ambulatory Visit: Payer: Self-pay

## 2020-07-30 VITALS — BP 112/60 | HR 44 | Ht 66.0 in | Wt 192.0 lb

## 2020-07-30 DIAGNOSIS — G4733 Obstructive sleep apnea (adult) (pediatric): Secondary | ICD-10-CM | POA: Diagnosis not present

## 2020-07-30 DIAGNOSIS — E7849 Other hyperlipidemia: Secondary | ICD-10-CM | POA: Diagnosis not present

## 2020-07-30 DIAGNOSIS — I700 Atherosclerosis of aorta: Secondary | ICD-10-CM

## 2020-07-30 DIAGNOSIS — E119 Type 2 diabetes mellitus without complications: Secondary | ICD-10-CM

## 2020-07-30 DIAGNOSIS — R9431 Abnormal electrocardiogram [ECG] [EKG]: Secondary | ICD-10-CM

## 2020-07-30 DIAGNOSIS — R001 Bradycardia, unspecified: Secondary | ICD-10-CM | POA: Diagnosis not present

## 2020-07-30 DIAGNOSIS — I10 Essential (primary) hypertension: Secondary | ICD-10-CM | POA: Diagnosis not present

## 2020-07-30 LAB — CBC
Hematocrit: 40 % (ref 37.5–51.0)
Hemoglobin: 12.9 g/dL — ABNORMAL LOW (ref 13.0–17.7)
MCH: 26.9 pg (ref 26.6–33.0)
MCHC: 32.3 g/dL (ref 31.5–35.7)
MCV: 84 fL (ref 79–97)
Platelets: 248 10*3/uL (ref 150–450)
RBC: 4.79 x10E6/uL (ref 4.14–5.80)
RDW: 13.5 % (ref 11.6–15.4)
WBC: 6.6 10*3/uL (ref 3.4–10.8)

## 2020-07-30 LAB — BASIC METABOLIC PANEL
BUN/Creatinine Ratio: 9 — ABNORMAL LOW (ref 10–24)
BUN: 11 mg/dL (ref 8–27)
CO2: 29 mmol/L (ref 20–29)
Calcium: 9 mg/dL (ref 8.6–10.2)
Chloride: 104 mmol/L (ref 96–106)
Creatinine, Ser: 1.25 mg/dL (ref 0.76–1.27)
Glucose: 107 mg/dL — ABNORMAL HIGH (ref 65–99)
Potassium: 4 mmol/L (ref 3.5–5.2)
Sodium: 143 mmol/L (ref 134–144)
eGFR: 63 mL/min/{1.73_m2} (ref >59)

## 2020-07-30 NOTE — Patient Instructions (Addendum)
Medication Instructions:   STOP TAKING BYSTOLIC 10 MG   *If you need a refill on your cardiac medications before your next appointment, please call your pharmacy*   Lab Work: BMET AND CBC TODAY   RETURN FOR FASTING LIPIDS ON FOLLOW UP APPOINTMENT DAY   If you have labs (blood work) drawn today and your tests are completely normal, you will receive your results only by: MyChart Message (if you have MyChart) OR A paper copy in the mail If you have any lab test that is abnormal or we need to change your treatment, we will call you to review the results.   Testing/Procedures: NONE ORDERED  TODAY   Follow-Up: At [facility], you and your health needs are our priority.  As part of our continuing mission to provide you with exceptional heart care, we have created designated Provider Care Teams.  These Care Teams include your primary Cardiologist (physician) and Advanced Practice Providers (APPs -  Physician Assistants and Nurse Practitioners) who all work together to provide you with the care you need, when you need it.  We recommend signing up for the patient portal called "MyChart".  Sign up information is provided on this After Visit Summary.  MyChart is used to connect with patients for Virtual Visits (Telemedicine).  Patients are able to view lab/test results, encounter notes, upcoming appointments, etc.  Non-urgent messages can be sent to your provider as well.   To learn more about what you can do with MyChart, go to [email or web address].    Your next appointment:   2-3 week(s)  The format for your next appointment:   In Person  Provider:   You may see Lesleigh Noe, MD or one of the following Advanced Practice Providers on your designated Care Team:   Georgie Chard, NP IF  APPOINTMENT SCHEDULE  ALLOW  CAN ADD TO HER SCHEDULED  PER JILL               (IF NOT ANY AVAILABLE APP CAN BE SCHEDULED WITH )    Other Instructions

## 2020-08-08 ENCOUNTER — Other Ambulatory Visit: Payer: Self-pay | Admitting: Interventional Cardiology

## 2020-08-13 ENCOUNTER — Telehealth: Payer: Self-pay | Admitting: Interventional Cardiology

## 2020-08-13 NOTE — Telephone Encounter (Signed)
Spoke with pt and made him aware that I was unable to locate who may have called him and what they were calling in regards to.  I did advise that we are still waiting on Jill to review his labs but everything looked good on there.  Advised we would call if she had any new instructions once she reviewed them.  Pt appreciative for call.

## 2020-08-13 NOTE — Telephone Encounter (Signed)
[name] [name] is calling stating he is returning a call from the office, but no VM was left. I was unable to find any documentation from our office in regards to this. Please advise.

## 2020-09-02 ENCOUNTER — Ambulatory Visit: Payer: HMO | Admitting: Physician Assistant

## 2020-09-06 ENCOUNTER — Other Ambulatory Visit: Payer: Self-pay | Admitting: Interventional Cardiology

## 2020-09-19 ENCOUNTER — Other Ambulatory Visit: Payer: Self-pay | Admitting: Interventional Cardiology

## 2020-10-25 ENCOUNTER — Ambulatory Visit: Payer: HMO | Attending: Internal Medicine

## 2020-10-25 DIAGNOSIS — Z23 Encounter for immunization: Secondary | ICD-10-CM

## 2020-10-25 NOTE — Progress Notes (Signed)
   [id number]  Name:  Lamario Mani.    MRN: [id number] DOB: [birth year]  10/25/2020  Mr. Hair was observed post Covid-19 immunization for 15 minutes without incident. He was provided with Vaccine Information Sheet and instruction to access the V-Safe system.   Mr. Yin was instructed to call 911 with any severe reactions post vaccine: Difficulty breathing  Swelling of face and throat  A fast heartbeat  A bad rash all over body  Dizziness and weakness

## 2020-11-05 ENCOUNTER — Other Ambulatory Visit (HOSPITAL_BASED_OUTPATIENT_CLINIC_OR_DEPARTMENT_OTHER): Payer: Self-pay

## 2020-11-05 MED ORDER — COVID-19MRNA BIVAL VACC PFIZER 30 MCG/0.3ML IM SUSP
INTRAMUSCULAR | 0 refills | Status: DC
  Filled 2020-11-05: qty 0.3, 1d supply, fill #0

## 2020-11-22 DIAGNOSIS — G4733 Obstructive sleep apnea (adult) (pediatric): Secondary | ICD-10-CM | POA: Diagnosis not present

## 2020-11-22 DIAGNOSIS — R9431 Abnormal electrocardiogram [ECG] [EKG]: Secondary | ICD-10-CM | POA: Diagnosis not present

## 2020-11-22 DIAGNOSIS — J301 Allergic rhinitis due to pollen: Secondary | ICD-10-CM | POA: Diagnosis not present

## 2020-11-22 DIAGNOSIS — Z6830 Body mass index (BMI) 30.0-30.9, adult: Secondary | ICD-10-CM | POA: Diagnosis not present

## 2020-11-22 DIAGNOSIS — R8290 Unspecified abnormal findings in urine: Secondary | ICD-10-CM | POA: Diagnosis not present

## 2020-11-22 DIAGNOSIS — Z0001 Encounter for general adult medical examination with abnormal findings: Secondary | ICD-10-CM | POA: Diagnosis not present

## 2020-11-22 DIAGNOSIS — Z733 Stress, not elsewhere classified: Secondary | ICD-10-CM | POA: Diagnosis not present

## 2020-11-22 DIAGNOSIS — E119 Type 2 diabetes mellitus without complications: Secondary | ICD-10-CM | POA: Diagnosis not present

## 2020-11-22 DIAGNOSIS — I10 Essential (primary) hypertension: Secondary | ICD-10-CM | POA: Diagnosis not present

## 2020-11-22 DIAGNOSIS — E782 Mixed hyperlipidemia: Secondary | ICD-10-CM | POA: Diagnosis not present

## 2020-11-22 DIAGNOSIS — D649 Anemia, unspecified: Secondary | ICD-10-CM | POA: Diagnosis not present

## 2020-11-22 DIAGNOSIS — D509 Iron deficiency anemia, unspecified: Secondary | ICD-10-CM | POA: Diagnosis not present

## 2021-01-06 DIAGNOSIS — G4733 Obstructive sleep apnea (adult) (pediatric): Secondary | ICD-10-CM | POA: Diagnosis not present

## 2021-01-06 DIAGNOSIS — I10 Essential (primary) hypertension: Secondary | ICD-10-CM | POA: Diagnosis not present

## 2021-01-06 DIAGNOSIS — D509 Iron deficiency anemia, unspecified: Secondary | ICD-10-CM | POA: Diagnosis not present

## 2021-01-06 DIAGNOSIS — K5221 Food protein-induced enterocolitis syndrome: Secondary | ICD-10-CM | POA: Diagnosis not present

## 2021-01-06 DIAGNOSIS — E860 Dehydration: Secondary | ICD-10-CM | POA: Diagnosis not present

## 2021-01-06 DIAGNOSIS — E782 Mixed hyperlipidemia: Secondary | ICD-10-CM | POA: Diagnosis not present

## 2021-01-06 DIAGNOSIS — K529 Noninfective gastroenteritis and colitis, unspecified: Secondary | ICD-10-CM | POA: Diagnosis not present

## 2021-01-06 DIAGNOSIS — E119 Type 2 diabetes mellitus without complications: Secondary | ICD-10-CM | POA: Diagnosis not present

## 2021-01-06 DIAGNOSIS — Z733 Stress, not elsewhere classified: Secondary | ICD-10-CM | POA: Diagnosis not present

## 2021-01-06 DIAGNOSIS — J301 Allergic rhinitis due to pollen: Secondary | ICD-10-CM | POA: Diagnosis not present

## 2021-02-26 ENCOUNTER — Ambulatory Visit: Payer: HMO | Admitting: Interventional Cardiology

## 2021-03-07 ENCOUNTER — Other Ambulatory Visit: Payer: Self-pay | Admitting: Interventional Cardiology

## 2021-04-01 ENCOUNTER — Other Ambulatory Visit (HOSPITAL_BASED_OUTPATIENT_CLINIC_OR_DEPARTMENT_OTHER): Payer: Self-pay

## 2021-06-09 NOTE — Progress Notes (Signed)
?Cardiology Office Note:   ? ?Date:  06/10/2021  ? ?ID:  [name] [name]., DOB [birth year], MRN [id number] ? ?PCP:  Gwenyth Bender, MD  ?Cardiologist:  Lesleigh Noe, MD  ? ?Referring MD: Gwenyth Bender, MD  ? ?Chief Complaint  ?Patient presents with  ? Hypertension  ? ? ?History of Present Illness:   ? ?[name] Sayegh. is a 71 y.o. male with a hx of  baseline abnormal EKG, negative ischemic evaluations, primary hypertension, ventricular hypertrophy on echocardiography, hyperlipidemia, CAC Score 0 in 2020, type 2 diabetes, and obstructive sleep apnea. ? ? ? ?He denies specific cardiac related symptoms such as dyspnea, orthopnea, angina quality chest pain, and palpitations.  Still works full-time as an Airline pilot.  Did have some right greater than left lower extremity edema during tax season as he was putting in long hours sitting.  This resolved when he began wearing compression stockings at the behest of his primary care physician, Dr. Willey Blade. ? ?Past Medical History:  ?Diagnosis Date  ? Abnormal EKG   ? Continue on Aspirin 81mg , Diagnostic Imaging:EC Stress test midmark (ordered for 03-02-2012)  ? Allergic rhinitis   ? Diabetes mellitus, type 2 (HCC)   ? Hyperlipidemia   ? stable  ? Hypertension   ? Obesity, mild   ? with 6 lbs weight loss  ? OSA (obstructive sleep apnea)   ? Periodontal disease   ? ? ?Past Surgical History:  ?Procedure Laterality Date  ? WISDOM TOOTH EXTRACTION    ? ? ?Current Medications: ?Current Meds  ?Medication Sig  ? aspirin 81 MG tablet Take 81 mg by mouth daily.    ? BD INSULIN SYRINGE U/F 30G X 1/2" 0.5 ML MISC 2 (two) times daily. as directed  ? COVID-19 mRNA bivalent vaccine, Pfizer, injection Inject into the muscle.  ? diltiazem (CARDIZEM CD) 360 MG 24 hr capsule TAKE (1) CAPSULE BY MOUTH DAILY.  ? glimepiride (AMARYL) 2 MG tablet Take 2 mg by mouth daily.   ? hydrochlorothiazide (HYDRODIURIL) 25 MG tablet TAKE ONE TABLET BY MOUTH ONE DAILY MUST MAKE AN APPOINTMENT FOR FUTURE FILLS   ? insulin aspart (NOVOLOG) 100 unit/mL injection Take as directed per sliding scale.  ? LANTUS 100 UNIT/ML injection 32 Units.  ? lisinopril (ZESTRIL) 40 MG tablet Take 1 tablet (40 mg total) by mouth daily. Please keep upcoming appt with Dr. 04-30-2012 in July 2022 before anymore refills.  ? Multiple Vitamin (MULTIVITAMIN) tablet Take 1 tablet by mouth daily.    ? nebivolol (BYSTOLIC) 10 MG tablet Take 1 tablet (10 mg total) by mouth daily.  ? simvastatin (ZOCOR) 40 MG tablet Take 1 tablet (40 mg total) by mouth at bedtime. Please make yearly appt with Dr. August 2022 for May 2022 for future refills. Thank you 1st attempt  ? sitaGLIPtin-metformin (JANUMET) 50-1000 MG tablet Take 1 tablet by mouth daily with supper.  ?  ? ?Allergies:   Patient has no known allergies.  ? ?Social History  ? ?Socioeconomic History  ? Marital status: Married  ?  Spouse name: Not on file  ? Number of children: Not on file  ? Years of education: Not on file  ? Highest education level: Not on file  ?Occupational History  ? Occupation: Retired  ?  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 Smoking status: Former  ?  Packs/day: 0.20  ?  Years: 17.00  ?  Pack years: 3.40  ?  Types: Cigarettes  ?  Quit date: 01/27/1983  ?  Years since quitting: 38.3  ? Smokeless tobacco: Never  ?Vaping Use  ? Vaping Use: Never used  ?Substance and Sexual Activity  ? Alcohol use: Yes  ?  Comment: once a month  ? Drug use: No  ? Sexual activity: Yes  ?Other Topics Concern  ? Not on file  ?Social History Narrative  ? Not on file  ? ?Social Determinants of Health  ? ?Financial Resource Strain: Not on file  ?Food Insecurity: Not on file  ?Transportation Needs: Not on file  ?Physical Activity: Not on file  ?Stress: Not on file  ?Social Connections: Not on file  ?  ? ?Family History: ?The patient's family history includes Diabetes in an other family member; Heart attack in his father and mother. ? ?ROS:   ?Please see the history of present illness.    ?Slight right  greater than left lower extremity/ankle edema.  No orthopnea.  Sleeps well.  Denies urinary symptoms.  All other systems reviewed and are negative. ? ?EKGs/Labs/Other Studies Reviewed:   ? ?The following studies were reviewed today: ? ?2 D Doppler ECHOCARDIOGRAPHY 2020: ?IMPRESSIONS  ? ? 1. The left ventricle has hyperdynamic systolic function of >65%. The  ?cavity size was normal. There is mildly increased left ventricular wall  ?thickness. Echo evidence of impaired diastolic relaxation.  ? 2. The right ventricle has normal systolic function. The cavity was  ?normal. There is no increase in right ventricular wall thickness.  ? 3. Left atrial size was moderately dilated.  ? 4. The mitral valve is normal in structure.  ? 5. The tricuspid valve is normal in structure.  ? 6. The aortic valve is tricuspid There is mild thickening of the aortic  ?valve.  ? 7. The pulmonic valve was normal in structure.  ? 8. The interatrial septum is aneurysmal.  ? 9. Vigorous LV systolic function; mild diastolic dysfunction; mild LVH;  ?elevated LVOT velocity of 2.5 m/s likely related to vigorous LV function;  ?aortic valve opens well and no SAM noted; moderate LAE.  ? ?EKG:  EKG sinus bradycardia at 49 bpm.  Relatively low voltage.  Precordial T wave abnormality.  Compared with the prior tracing from July 2022, the heart rate is slightly faster. ? ?Recent Labs: ?07/30/2020: BUN 11; Creatinine, Ser 1.25; Hemoglobin 12.9; Platelets 248; Potassium 4.0; Sodium 143  ?Recent Lipid Panel ?No results found for: CHOL, TRIG, HDL, CHOLHDL, VLDL, LDLCALC, LDLDIRECT ? ?Physical Exam:   ? ?VS:  BP 138/62   Pulse (!) 49   Ht 5\' 6"  (1.676 m)   Wt 202 lb 6.4 oz (91.8 kg)   SpO2 95%   BMI 32.67 kg/m?    ? ?Wt Readings from Last 3 Encounters:  ?06/10/21 202 lb 6.4 oz (91.8 kg)  ?07/30/20 192 lb (87.1 kg)  ?06/19/19 188 lb (85.3 kg)  ?  ? ?GEN: Morbid obesity. No acute distress ?HEENT: Normal ?NECK: No JVD. ?LYMPHATICS: No lymphadenopathy ?CARDIAC: No  murmur. RRR no gallop, or edema. ?VASCULAR:  Normal Pulses. No bruits. ?RESPIRATORY:  Clear to auscultation without rales, wheezing or rhonchi  ?ABDOMEN: Soft, non-tender, non-distended, No pulsatile mass, ?MUSCULOSKELETAL: No deformity  ?SKIN: Warm and dry ?NEUROLOGIC:  Alert and oriented x 3 ?PSYCHIATRIC:  Normal affect  ? ?ASSESSMENT:   ? ?1. Essential hypertension   ?2. Aortic atherosclerosis (HCC)   ?3. Type 2 diabetes mellitus without complication, without long-term current use of insulin (HCC)   ?4. Other hyperlipidemia   ?5.  Obstructive sleep apnea   ?6. Abnormal EKG   ? ?PLAN:   ? ?In order of problems listed above: ? ?Excellent blood pressure control.  We have to be careful with nebivolol and diltiazem as it may cause excessive bradycardia.  Heart rate is in the upper 40s and the patient is asymptomatic.  No action at this time. ?Continue statin therapy with target LDL less than 70.  This is followed by Dr. August Saucerean. ?He is currently on Janumet and insulin including Lantus.  If he develops any dyspnea to suggest diastolic heart failure, an SGLT2 would be helpful. ?Continue simvastatin 40 mg/day. ?CPAP use is encouraged ?EKG is unchanged over time.  EKG abnormalities are a reflection of left ventricular hypertrophy.  Should probably consider repeat echocardiogram within the next 6 to 12 months.  Calcium score is 0. ? ? ?Medication Adjustments/Labs and Tests Ordered: ?Current medicines are reviewed at length with the patient today.  Concerns regarding medicines are outlined above.  ?Orders Placed This Encounter  ?Procedures  ? EKG 12-Lead  ? ?Meds ordered this encounter  ?Medications  ? nebivolol (BYSTOLIC) 10 MG tablet  ?  Sig: Take 1 tablet (10 mg total) by mouth daily.  ? ? ?Patient Instructions  ?Medication Instructions:  ?Your physician recommends that you continue on your current medications as directed. Please refer to the Current Medication list given to you today. ? ?*If you need a refill on your  cardiac medications before your next appointment, please call your pharmacy* ? ?Lab Work: ?NONE ? ?Testing/Procedures: ?NONE ? ?Follow-Up: ?At [facility], you and your health needs are our priority.  As part

## 2021-06-10 ENCOUNTER — Ambulatory Visit: Payer: HMO | Admitting: Interventional Cardiology

## 2021-06-10 ENCOUNTER — Encounter: Payer: Self-pay | Admitting: Interventional Cardiology

## 2021-06-10 VITALS — BP 138/62 | HR 49 | Ht 66.0 in | Wt 202.4 lb

## 2021-06-10 DIAGNOSIS — I700 Atherosclerosis of aorta: Secondary | ICD-10-CM | POA: Diagnosis not present

## 2021-06-10 DIAGNOSIS — E119 Type 2 diabetes mellitus without complications: Secondary | ICD-10-CM

## 2021-06-10 DIAGNOSIS — R9431 Abnormal electrocardiogram [ECG] [EKG]: Secondary | ICD-10-CM

## 2021-06-10 DIAGNOSIS — G4733 Obstructive sleep apnea (adult) (pediatric): Secondary | ICD-10-CM

## 2021-06-10 DIAGNOSIS — E7849 Other hyperlipidemia: Secondary | ICD-10-CM

## 2021-06-10 DIAGNOSIS — I10 Essential (primary) hypertension: Secondary | ICD-10-CM

## 2021-06-10 MED ORDER — NEBIVOLOL HCL 10 MG PO TABS: 10.0000 mg | ORAL_TABLET | Freq: Every day | ORAL | Status: AC

## 2021-06-10 NOTE — Patient Instructions (Addendum)

## 2021-07-02 ENCOUNTER — Other Ambulatory Visit: Payer: Self-pay | Admitting: Interventional Cardiology

## 2021-07-04 ENCOUNTER — Other Ambulatory Visit: Payer: Self-pay | Admitting: Interventional Cardiology

## 2022-02-21 LAB — LAB REPORT - SCANNED
A1c: 8.7
EGFR: 58

## 2022-03-10 ENCOUNTER — Other Ambulatory Visit: Payer: Self-pay

## 2022-03-10 MED ORDER — LISINOPRIL 40 MG PO TABS: ORAL_TABLET | ORAL | 1 refills | Status: DC

## 2022-06-12 ENCOUNTER — Ambulatory Visit: Payer: HMO | Admitting: Internal Medicine

## 2022-06-30 LAB — LAB REPORT - SCANNED
A1c: 6.5
EGFR: 50

## 2022-07-15 ENCOUNTER — Ambulatory Visit: Payer: HMO | Admitting: Physician Assistant

## 2022-07-15 NOTE — Progress Notes (Deleted)
  Cardiology Office Note:  .   Date:  07/15/2022  ID:  [name] Coma., DOB 05/01/1950, MRN [id number] PCP: Gwenyth Bender, MD  [facility] Providers Cardiologist:  Lesleigh Noe, MD (Inactive) {   History of Present Illness: .   [name] [name]. is a 72 y.o. male with a past medical history of abnormal EKG, negative ischemic evaluations, primary hypertension, ventricular hypertrophy on echocardiogram, hyperlipidemia, CAC score of 0 back in 2020, type 2 diabetes mellitus, and OSA.  He was seen by Dr. Katrinka Blazing last year 06/10/2021 and at that time did not have any specific cardiac related symptoms such as dyspnea, orthopnea, angina quality chest pain, or palpitations.  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  Did have some right greater than left lower extremity edema during tax season as he was putting in long hours of sitting.  That resolved when he began wearing compression stockings.  Today, he***  ROS: ***  Studies Reviewed: .        *** Risk Assessment/Calculations:   {Does this patient have ATRIAL FIBRILLATION?[phone]} No BP recorded.  {Refresh Note OR Click here to enter BP  :1}***       Physical Exam:   VS:  There were no vitals taken for this visit.   Wt Readings from Last 3 Encounters:  06/10/21 202 lb 6.4 oz (91.8 kg)  07/30/20 192 lb (87.1 kg)  06/19/19 188 lb (85.3 kg)    GEN: Well nourished, well developed in no acute distress NECK: No JVD; No carotid bruits CARDIAC: ***RRR, no murmurs, rubs, gallops RESPIRATORY:  Clear to auscultation without rales, wheezing or rhonchi  ABDOMEN: Soft, non-tender, non-distended EXTREMITIES:  No edema; No deformity   ASSESSMENT AND PLAN: .   1.  Essential hypertension 2.  Aortic atherosclerosis 3.  Type 2 diabetes mellitus 4.  Hyperlipidemia 5.  OSA 6.  Abnormal EKG    {Are you ordering a CV Procedure (e.g. stress test, cath, DCCV, TEE, etc)?   Press F2        :[id number]}  Dispo:  ***  Signed, Sharlene Dory, PA-C

## 2022-08-19 ENCOUNTER — Ambulatory Visit: Payer: HMO | Admitting: Internal Medicine

## 2022-08-19 NOTE — Progress Notes (Deleted)
Cardiology Office Note:    Date:  08/19/2022   ID:  [name] Coma., DOB November 30, 1950, MRN [id number]  PCP:  Gwenyth Bender, MD  Cardiologist:  Lesleigh Noe, MD (Inactive)   Referring MD: Gwenyth Bender, MD   CC: Transition to new cardiologist   History of Present Illness:    [name] Balfour. is a 72 y.o. male with a hx of  baseline abnormal EKG, negative ischemic evaluations, primary hypertension, ventricular hypertrophy on echocardiography, hyperlipidemia, CAC Score 0 in 2020, type 2 diabetes, and obstructive sleep apnea. 2023: Last visit with Dr. Katrinka Blazing.  Doing well.  Patient notes that (s)he is doing ***.   Since last visit notes *** . There are no*** interval hospital/ED visit.   EKG showed ***  No chest pain or pressure ***.  No SOB/DOE*** and no PND/Orthopnea***.  No weight gain or leg swelling***.  No palpitations or syncope ***.  Ambulatory blood pressure ***.    Past Medical History:  Diagnosis Date   Abnormal EKG    Continue on Aspirin 81mg , Diagnostic Imaging:EC Stress test midmark (ordered for 03-02-2012)   Allergic rhinitis    Diabetes mellitus, type 2 (HCC)    Hyperlipidemia    stable   Hypertension    Obesity, mild    with 6 lbs weight loss   OSA (obstructive sleep apnea)    Periodontal disease     Past Surgical History:  Procedure Laterality Date   WISDOM TOOTH EXTRACTION      Current Medications: No outpatient medications have been marked as taking for the 08/19/22 encounter (Appointment) with Christell Constant, MD.     Allergies:   Patient has no known allergies.   Social History   Socioeconomic History   Marital status: Widowed    Spouse name: Not on file   Number of children: Not on file   Years of education: Not on file   Highest education level: Not on file  Occupational History   Occupation: Retired    Comment: IT trainer with [employer]  Tobacco Use   Smoking status: Former    Current packs/day: 0.00    Average  packs/day: 0.2 packs/day for 17.0 years (3.4 ttl pk-yrs)    Types: Cigarettes    Start date: 01/26/1966    Quit date: 01/27/1983    Years since quitting: 39.5   Smokeless tobacco: Never  Vaping Use   Vaping status: Never Used  Substance and Sexual Activity   Alcohol use: Yes    Comment: once a month   Drug use: No   Sexual activity: Yes  Other Topics Concern   Not on file  Social History Narrative   Not on file   Social Determinants of Health   Financial Resource Strain: Not on file  Food Insecurity: No Food Insecurity (02/23/2018)   Hunger Vital Sign    Worried About Running Out of Food in the Last Year: Never true    Ran Out of Food in the Last Year: Never true  Transportation Needs: No Transportation Needs (02/23/2018)   PRAPARE - Administrator, Civil Service (Medical): No    Lack of Transportation (Non-Medical): No  Physical Activity: Not on file  Stress: Not on file  Social Connections: Not on file     Family History: The patient's family history includes Diabetes in an other family member; Heart attack in his father and mother.  ROS:   Please see the history of present illness.  EKGs/Labs/Other Studies Reviewed:    Cardiac Studies & Procedures     STRESS TESTS  EXERCISE TOLERANCE TEST (ETT) 03/04/2018  Narrative  Blood pressure demonstrated a hypertensive response to exercise.  Clinically and electrically negative for ischemia   ECHOCARDIOGRAM  ECHOCARDIOGRAM COMPLETE 03/04/2018  Narrative ECHOCARDIOGRAM REPORT    Patient Name:   [name] Sciandra. Date of Exam: 03/04/2018 Medical Rec #:  [id number]         Height:       65.0 in Accession #:    [id number]        Weight:       196.8 lb Date of Birth:  1950-02-18         BSA:          1.96 m Patient Age:    67 years          BP:           146/64 mmHg Patient Gender: M                 HR:           56 bpm. Exam Location:  [address]   Procedure: 2D Echo, 3D Echo, Color Doppler and Cardiac  Doppler  Indications:    I10 Hypertension I42.2 HOCM  History:        Patient has no prior history of Echocardiogram examinations. Abnormal ECG; Risk Factors: Diabetes, Hypertension, Dyslipidemia, Family History of Coronary Artery Disease and Former Smoker. Obesity, Obstructive Sleep Apnea.  Sonographer:    Farrel Conners RDCS Referring Phys: [phone] [facility]  IMPRESSIONS   1. The left ventricle has hyperdynamic systolic function of >65%. The cavity size was normal. There is mildly increased left ventricular wall thickness. Echo evidence of impaired diastolic relaxation. 2. The right ventricle has normal systolic function. The cavity was normal. There is no increase in right ventricular wall thickness. 3. Left atrial size was moderately dilated. 4. The mitral valve is normal in structure. 5. The tricuspid valve is normal in structure. 6. The aortic valve is tricuspid There is mild thickening of the aortic valve. 7. The pulmonic valve was normal in structure. 8. The interatrial septum is aneurysmal. 9. Vigorous LV systolic function; mild diastolic dysfunction; mild LVH; elevated LVOT velocity of 2.5 m/s likely related to vigorous LV function; aortic valve opens well and no SAM noted; moderate LAE.  FINDINGS Left Ventricle: The left ventricle has hyperdynamic systolic function of >65%. The cavity size was normal. There is mildly increased left ventricular wall thickness. Echo evidence of impaired diastolic relaxation Right Ventricle: The right ventricle has normal systolic function. The cavity was normal. There is no increase in right ventricular wall thickness. Left Atrium: left atrial size was moderately dilated Right Atrium: right atrial size was normal in size Interatrial Septum: No atrial level shunt detected by color flow Doppler. An The interatrial septum is aneurysmal is seen.  Pericardium: There is no evidence of pericardial effusion. Mitral Valve: The mitral valve is  normal in structure. Mitral valve regurgitation is trivial by color flow Doppler. Tricuspid Valve: The tricuspid valve is normal in structure. Tricuspid valve regurgitation is trivial by color flow Doppler. Aortic Valve: The aortic valve is tricuspid There is mild thickening of the aortic valve. Aortic valve regurgitation was not visualized by color flow Doppler. Pulmonic Valve: The pulmonic valve was normal in structure. Pulmonic valve regurgitation is not visualized by color flow Doppler. Venous: The inferior vena cava is normal in size  with greater than 50% respiratory variability.  LEFT VENTRICLE PLAX 2D (Teich) LV EF:          71.2 %   Diastology LVIDd:          3.39 cm  LV e' lateral:   11.00 cm/s LVIDs:          2.05 cm  LV E/e' lateral: 8.9 LV PW:          0.96 cm  LV e' medial:    7.51 cm/s LV IVS:         1.18 cm  LV E/e' medial:  13.0 LVOT diam:      2.10 cm LV SV:          34 ml LVOT Area:      3.46 cm  RIGHT VENTRICLE RV S prime:     13.20 cm/s TAPSE (M-mode): 2.3 cm RVSP:           25.8 mmHg  LEFT ATRIUM             Index       RIGHT ATRIUM           Index LA diam:        3.80 cm 1.93 cm/m  RA Pressure: 3 mmHg LA Vol (A2C):   86.6 ml 44.08 ml/m RA Area:     13.50 cm LA Vol (A4C):   78.4 ml 39.91 ml/m RA Volume:   29.00 ml  14.76 ml/m LA Biplane Vol: 82.4 ml 41.94 ml/m AORTIC VALVE AV Area (Vmax):    1.78 cm AV Area (Vmean):   1.69 cm AV Area (VTI):     2.07 cm AV Vmax:           255.00 cm/s AV Vmean:          167.000 cm/s AV VTI:            0.550 m AV Peak Grad:      26.0 mmHg AV Mean Grad:      13.0 mmHg LVOT Vmax:         131.00 cm/s LVOT Vmean:        81.600 cm/s LVOT VTI:          0.329 m LVOT/AV VTI ratio: 0.60  AORTA Ao Root diam: 3.60 cm Ao Asc diam:  3.50 cm  MITRAL VALVE               TR Peak grad: 22.8 mmHg MV Area (PHT):             TR Vmax:      240.00 cm/s MV PHT:                    RVSP:         25.8 mmHg MV Decel Time: 324  msec MV E velocity: 97.85 cm/s MV A velocity: 118.50 cm/s MV E/A ratio:  0.83   Olga Millers MD Electronically signed by Olga Millers MD Signature Date/Time: 03/04/2018/5:18:27 PM    Final     CT SCANS  CT CARDIAC SCORING (SELF PAY ONLY) 03/04/2018  Addendum 03/07/2018 12:30 PM ADDENDUM REPORT: 03/07/2018 12:28  CLINICAL DATA:  Risk stratification  EXAM: Coronary Calcium Score  TECHNIQUE: The patient was scanned on a Siemens Somatom 64 slice scanner. Axial non-contrast 3 mm slices were carried out through the heart. The data set was analyzed on a dedicated work station and scored using the Agatson method.  FINDINGS: Non-cardiac: See separate report from  [facility].  Ascending aorta: Some calcific atherosclerosis normal diameter 3.6 cm Some aortic valve calcification  Pericardium: Normal  Coronary arteries: No coronary calcium noted  IMPRESSION: Coronary calcium score of 0.  Some calcific aortic valve disease and atherosclerosis of the aortic root  Charlton Haws   Electronically Signed By: Charlton Haws M.D. On: 03/07/2018 12:28  Narrative EXAM: OVER-READ INTERPRETATION  CT CHEST  The following report is an over-read performed by radiologist Dr. Genevive Bi of [facility] on 03/04/2018. This over-read does not include interpretation of cardiac or coronary anatomy or pathology. The coronary calcium score interpretation by the cardiologist is attached.  COMPARISON:  None.  FINDINGS: Limited view of the lung parenchyma demonstrates no suspicious nodularity. Airways are normal.  Limited view of the mediastinum demonstrates no adenopathy. Esophagus normal.  Limited view of the upper abdomen unremarkable.  Limited view of the skeleton and chest wall is unremarkable.  IMPRESSION: No significant extracardiac findings  Electronically Signed: By: Genevive Bi M.D. On: 03/04/2018 14:48           EKG:  EKG sinus  bradycardia at 49 bpm.  Relatively low voltage.  Precordial T wave abnormality.  Compared with the prior tracing from July 2022, the heart rate is slightly faster.  Recent Labs: No results found for requested labs within last 365 days.  Recent Lipid Panel No results found for: "CHOL", "TRIG", "HDL", "CHOLHDL", "VLDL", "LDLCALC", "LDLDIRECT"  Physical Exam:    VS:  There were no vitals taken for this visit.    Wt Readings from Last 3 Encounters:  06/10/21 202 lb 6.4 oz (91.8 kg)  07/30/20 192 lb (87.1 kg)  06/19/19 188 lb (85.3 kg)     Gen: *** distress, *** obese/well nourished/malnourished   Neck: No JVD, *** carotid bruit Ears: *** Frank Sign Cardiac: No Rubs or Gallops, *** Murmur, ***cardia, *** radial pulses Respiratory: Clear to auscultation bilaterally, *** effort, ***  respiratory rate GI: Soft, nontender, non-distended *** MS: No *** edema; *** moves all extremities Integument: Skin feels *** Neuro:  At time of evaluation, alert and oriented to person/place/time/situation *** Psych: Normal affect, patient feels ***   ASSESSMENT:    No diagnosis found.  PLAN:     Hypertension with DM and LVH Morbid obesity  Iatrogenic Bradycardia  HLD with zero calcium score  OSA -CPAP encouraged    Medication Adjustments/Labs and Tests Ordered: Current medicines are reviewed at length with the patient today.  Concerns regarding medicines are outlined above.  No orders of the defined types were placed in this encounter.  No orders of the defined types were placed in this encounter.   There are no Patient Instructions on file for this visit.   Signed, Christell Constant, MD  08/19/2022 7:54 AM    [facility]

## 2022-09-06 ENCOUNTER — Other Ambulatory Visit: Payer: Self-pay | Admitting: Internal Medicine

## 2022-09-08 NOTE — Progress Notes (Unsigned)
Cardiology Office Note:    Date:  09/10/2022   ID:  [name], DOB Nov 10, 1950, MRN [id number]  PCP:  Gwenyth Bender, MD  Cardiologist:  Lesleigh Noe, MD (Inactive)   Referring MD: Gwenyth Bender, MD   CC: Transition to new cardiologist   History of Present Illness:    Hanan Vado. is a 72 y.o. male with a hx of  baseline abnormal EKG, negative ischemic evaluations, primary hypertension, ventricular hypertrophy on echocardiography, hyperlipidemia, CAC Score 0 in 2020, type 2 diabetes, and obstructive sleep apnea. 2023: Last visit with Dr. Katrinka Blazing.  Doing well.  Friends with Dr. Katrinka Blazing.  Patient notes that he is doing well.   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 There are no interval hospital/ED visit.   EKG showed septal infarct patternin 2023.  This resolved at todays visit, has lateral TWI  No chest pain or pressure.  No SOB/DOE and no PND/Orthopnea.  No weight gain or leg swelling.  No palpitations or syncope.  He has lost weight since last year.   Past Medical History:  Diagnosis Date   Abnormal EKG    Continue on Aspirin 81mg , Diagnostic Imaging:EC Stress test midmark (ordered for 03-02-2012)   Allergic rhinitis    Diabetes mellitus, type 2 (HCC)    Hyperlipidemia    stable   Hypertension    Obesity, mild    with 6 lbs weight loss   OSA (obstructive sleep apnea)    Periodontal disease     Past Surgical History:  Procedure Laterality Date   WISDOM TOOTH EXTRACTION      Current Medications: Current Meds  Medication Sig   aspirin 81 MG tablet Take 81 mg by mouth daily.     BD INSULIN SYRINGE U/F 30G X 1/2" 0.5 ML MISC 2 (two) times daily. as directed   diltiazem (CARDIZEM CD) 360 MG 24 hr capsule TAKE ONE CAPSULE BY MOUTH DAILY   glimepiride (AMARYL) 2 MG tablet Take 2 mg by mouth daily.    hydrochlorothiazide (HYDRODIURIL) 25 MG tablet  Take 1 tablet (25 mg total) by mouth daily.   insulin aspart (NOVOLOG) 100 unit/mL injection Take as directed per sliding scale.   LANTUS 100 UNIT/ML injection 32 Units.   lisinopril (ZESTRIL) 40 MG tablet Take 1 tablet (40 mg total) by mouth daily.   Multiple Vitamin (MULTIVITAMIN) tablet Take 1 tablet by mouth daily.     nebivolol (BYSTOLIC) 10 MG tablet Take 1 tablet (10 mg total) by mouth daily.   simvastatin (ZOCOR) 40 MG tablet Take 1 tablet (40 mg total) by mouth at bedtime. Please make yearly appt with Dr. Katrinka Blazing for May 2022 for future refills. Thank you 1st attempt   sitaGLIPtin-metformin (JANUMET) 50-1000 MG tablet Take 1 tablet by mouth daily with supper.     Allergies:   Patient has no known allergies.   Social History   Socioeconomic History   Marital status: Widowed    Spouse name: Not on file   Number of children: Not on file   Years of education: Not on file   Highest education level: Not on file  Occupational History   Occupation: Retired    Comment: IT trainer with [employer]  Tobacco Use   Smoking status: Former    Current packs/day: 0.00    Average packs/day: 0.2  packs/day for 17.0 years (3.4 ttl pk-yrs)    Types: Cigarettes    Start date: 01/26/1966    Quit date: 01/27/1983    Years since quitting: 39.6   Smokeless tobacco: Never  Vaping Use   Vaping status: Never Used  Substance and Sexual Activity   Alcohol use: Yes    Comment: once a month   Drug use: No   Sexual activity: Yes  Other Topics Concern   Not on file  Social History Narrative   Not on file   Social Determinants of Health   Financial Resource Strain: Not on file  Food Insecurity: No Food Insecurity (02/23/2018)   Hunger Vital Sign    Worried About Running Out of Food in the Last Year: Never true    Ran Out of Food in the Last Year: Never true  Transportation Needs: No Transportation Needs (02/23/2018)   PRAPARE - Administrator, Civil Service (Medical): No    Lack of  Transportation (Non-Medical): No  Physical Activity: Not on file  Stress: Not on file  Social Connections: Not on file     Family History: The patient's family history includes Diabetes in an other family member; Heart attack in his father and mother.  ROS:   Please see the history of present illness.     EKGs/Labs/Other Studies Reviewed:    Cardiac Studies & Procedures     STRESS TESTS  EXERCISE TOLERANCE TEST (ETT) 03/04/2018  Narrative  Blood pressure demonstrated a hypertensive response to exercise.  Clinically and electrically negative for ischemia   ECHOCARDIOGRAM  ECHOCARDIOGRAM COMPLETE 03/04/2018  Narrative ECHOCARDIOGRAM REPORT    Patient Name:   Coal Shideler. Date of Exam: 03/04/2018 Medical Rec #:  [id number]         Height:       65.0 in Accession #:    [id number]        Weight:       196.8 lb Date of Birth:  [birth year]         BSA:          1.96 m Patient Age:    67 years          BP:           146/64 mmHg Patient Gender: M                 HR:           56 bpm. Exam Location:  [address]   Procedure: 2D Echo, 3D Echo, Color Doppler and Cardiac Doppler  Indications:    I10 Hypertension I42.2 HOCM  History:        Patient has no prior history of Echocardiogram examinations. Abnormal ECG; Risk Factors: Diabetes, Hypertension, Dyslipidemia, Family History of Coronary Artery Disease and Former Smoker. Obesity, Obstructive Sleep Apnea.  Sonographer:    Farrel Conners RDCS Referring Phys: [phone] [facility]  IMPRESSIONS   1. The left ventricle has hyperdynamic systolic function of >65%. The cavity size was normal. There is mildly increased left ventricular wall thickness. Echo evidence of impaired diastolic relaxation. 2. The right ventricle has normal systolic function. The cavity was normal. There is no increase in right ventricular wall thickness. 3. Left atrial size was moderately dilated. 4. The mitral valve is normal in structure. 5. The  tricuspid valve is normal in structure. 6. The aortic valve is tricuspid There is mild thickening of the aortic valve. 7. The pulmonic valve was normal  in structure. 8. The interatrial septum is aneurysmal. 9. Vigorous LV systolic function; mild diastolic dysfunction; mild LVH; elevated LVOT velocity of 2.5 m/s likely related to vigorous LV function; aortic valve opens well and no SAM noted; moderate LAE.  FINDINGS Left Ventricle: The left ventricle has hyperdynamic systolic function of >65%. The cavity size was normal. There is mildly increased left ventricular wall thickness. Echo evidence of impaired diastolic relaxation Right Ventricle: The right ventricle has normal systolic function. The cavity was normal. There is no increase in right ventricular wall thickness. Left Atrium: left atrial size was moderately dilated Right Atrium: right atrial size was normal in size Interatrial Septum: No atrial level shunt detected by color flow Doppler. An The interatrial septum is aneurysmal is seen.  Pericardium: There is no evidence of pericardial effusion. Mitral Valve: The mitral valve is normal in structure. Mitral valve regurgitation is trivial by color flow Doppler. Tricuspid Valve: The tricuspid valve is normal in structure. Tricuspid valve regurgitation is trivial by color flow Doppler. Aortic Valve: The aortic valve is tricuspid There is mild thickening of the aortic valve. Aortic valve regurgitation was not visualized by color flow Doppler. Pulmonic Valve: The pulmonic valve was normal in structure. Pulmonic valve regurgitation is not visualized by color flow Doppler. Venous: The inferior vena cava is normal in size with greater than 50% respiratory variability.  LEFT VENTRICLE PLAX 2D (Teich) LV EF:          71.2 %   Diastology LVIDd:          3.39 cm  LV e' lateral:   11.00 cm/s LVIDs:          2.05 cm  LV E/e' lateral: 8.9 LV PW:          0.96 cm  LV e' medial:    7.51 cm/s LV IVS:          1.18 cm  LV E/e' medial:  13.0 LVOT diam:      2.10 cm LV SV:          34 ml LVOT Area:      3.46 cm  RIGHT VENTRICLE RV S prime:     13.20 cm/s TAPSE (M-mode): 2.3 cm RVSP:           25.8 mmHg  LEFT ATRIUM             Index       RIGHT ATRIUM           Index LA diam:        3.80 cm 1.93 cm/m  RA Pressure: 3 mmHg LA Vol (A2C):   86.6 ml 44.08 ml/m RA Area:     13.50 cm LA Vol (A4C):   78.4 ml 39.91 ml/m RA Volume:   29.00 ml  14.76 ml/m LA Biplane Vol: 82.4 ml 41.94 ml/m AORTIC VALVE AV Area (Vmax):    1.78 cm AV Area (Vmean):   1.69 cm AV Area (VTI):     2.07 cm AV Vmax:           255.00 cm/s AV Vmean:          167.000 cm/s AV VTI:            0.550 m AV Peak Grad:      26.0 mmHg AV Mean Grad:      13.0 mmHg LVOT Vmax:         131.00 cm/s LVOT Vmean:        81.600 cm/s  LVOT VTI:          0.329 m LVOT/AV VTI ratio: 0.60  AORTA Ao Root diam: 3.60 cm Ao Asc diam:  3.50 cm  MITRAL VALVE               TR Peak grad: 22.8 mmHg MV Area (PHT):             TR Vmax:      240.00 cm/s MV PHT:                    RVSP:         25.8 mmHg MV Decel Time: 324 msec MV E velocity: 97.85 cm/s MV A velocity: 118.50 cm/s MV E/A ratio:  0.83   Olga Millers MD Electronically signed by Olga Millers MD Signature Date/Time: 03/04/2018/5:18:27 PM    Final     CT SCANS  CT CARDIAC SCORING (SELF PAY ONLY) 03/04/2018  Addendum 03/07/2018 12:30 PM ADDENDUM REPORT: 03/07/2018 12:28  CLINICAL DATA:  Risk stratification  EXAM: Coronary Calcium Score  TECHNIQUE: The patient was scanned on a Siemens Somatom 64 slice scanner. Axial non-contrast 3 mm slices were carried out through the heart. The data set was analyzed on a dedicated work station and scored using the Agatson method.  FINDINGS: Non-cardiac: See separate report from [facility].  Ascending aorta: Some calcific atherosclerosis normal diameter 3.6 cm Some aortic valve calcification  Pericardium:  Normal  Coronary arteries: No coronary calcium noted  IMPRESSION: Coronary calcium score of 0.  Some calcific aortic valve disease and atherosclerosis of the aortic root  Charlton Haws   Electronically Signed By: Charlton Haws M.D. On: 03/07/2018 12:28  Narrative EXAM: OVER-READ INTERPRETATION  CT CHEST  The following report is an over-read performed by radiologist Dr. Genevive Bi of [facility] on 03/04/2018. This over-read does not include interpretation of cardiac or coronary anatomy or pathology. The coronary calcium score interpretation by the cardiologist is attached.  COMPARISON:  None.  FINDINGS: Limited view of the lung parenchyma demonstrates no suspicious nodularity. Airways are normal.  Limited view of the mediastinum demonstrates no adenopathy. Esophagus normal.  Limited view of the upper abdomen unremarkable.  Limited view of the skeleton and chest wall is unremarkable.  IMPRESSION: No significant extracardiac findings  Electronically Signed: By: Genevive Bi M.D. On: 03/04/2018 14:48          Recent Labs: No results found for requested labs within last 365 days.  Recent Lipid Panel No results found for: "CHOL", "TRIG", "HDL", "CHOLHDL", "VLDL", "LDLCALC", "LDLDIRECT"  Physical Exam:    VS:  BP (!) 138/58   Pulse (!) 50   Ht 5\' 5"  (1.651 m)   Wt 197 lb (89.4 kg)   SpO2 96%   BMI 32.78 kg/m     Wt Readings from Last 3 Encounters:  09/10/22 197 lb (89.4 kg)  06/10/21 202 lb 6.4 oz (91.8 kg)  07/30/20 192 lb (87.1 kg)     Gen: no distress, obese   Neck: No JVD Cardiac: No Rubs or Gallops, Systolic murmur worse with Valsalva, regular bradycardia, +2 radial pulses Respiratory: Clear to auscultation bilaterally, normal effort, normal  respiratory rate GI: Soft, nontender, distended with no fluid wave MS: No  edema;  moves all extremities Integument: Skin feels warm Neuro:  At time of evaluation, alert and oriented  to person/place/time/situation  Psych: Normal affect, patient feels ok   ASSESSMENT:    1. Heart murmur   2.  Essential hypertension   3. Aortic atherosclerosis (HCC)   4. Obstructive sleep apnea   5. Other hyperlipidemia   6. Obesity, mild     PLAN:    Heart Murmur Hypertension with DM and LVH - No LV hypertrophy; no symptoms, has 1 son, 2 daugthers - will repeat echo - AMB BP controlled on current therapy; continue  Iatrogenic Bradycardia - asymptomatic on current therapy  HLD with zero calcium score - seeing Dr. August Saucer tomorrow, will need labs from him next week - continue current statin  OSA -CPAP encouraged   Patients ask to be paired with MD as much as possible - will do out best to honor this request  Follow up with me in one year   Medication Adjustments/Labs and Tests Ordered: Current medicines are reviewed at length with the patient today.  Concerns regarding medicines are outlined above.  Orders Placed This Encounter  Procedures   EKG 12-Lead   No orders of the defined types were placed in this encounter.   There are no Patient Instructions on file for this visit.   Signed, Christell Constant, MD  09/10/2022 11:08 AM    [facility]

## 2022-09-10 ENCOUNTER — Ambulatory Visit: Payer: PPO | Attending: Internal Medicine | Admitting: Internal Medicine

## 2022-09-10 ENCOUNTER — Encounter: Payer: Self-pay | Admitting: Internal Medicine

## 2022-09-10 VITALS — BP 138/58 | HR 50 | Ht 65.0 in | Wt 197.0 lb

## 2022-09-10 DIAGNOSIS — E7849 Other hyperlipidemia: Secondary | ICD-10-CM

## 2022-09-10 DIAGNOSIS — I10 Essential (primary) hypertension: Secondary | ICD-10-CM

## 2022-09-10 DIAGNOSIS — I700 Atherosclerosis of aorta: Secondary | ICD-10-CM | POA: Diagnosis not present

## 2022-09-10 DIAGNOSIS — G4733 Obstructive sleep apnea (adult) (pediatric): Secondary | ICD-10-CM | POA: Diagnosis not present

## 2022-09-10 DIAGNOSIS — R011 Cardiac murmur, unspecified: Secondary | ICD-10-CM

## 2022-09-10 DIAGNOSIS — E669 Obesity, unspecified: Secondary | ICD-10-CM

## 2022-09-10 NOTE — Patient Instructions (Signed)
Medication Instructions:  Your physician recommends that you continue on your current medications as directed. Please refer to the Current Medication list given to you today.  *If you need a refill on your cardiac medications before your next appointment, please call your pharmacy*   Lab Work: NONE  If you have labs (blood work) drawn today and your tests are completely normal, you will receive your results only by: MyChart Message (if you have MyChart) OR A paper copy in the mail If you have any lab test that is abnormal or we need to change your treatment, we will call you to review the results.   Testing/Procedures: Your physician has requested that you have an echocardiogram. Echocardiography is a painless test that uses sound waves to create images of your heart. It provides your doctor with information about the size and shape of your heart and how well your heart's chambers and valves are working. This procedure takes approximately one hour. There are no restrictions for this procedure. Please do NOT wear cologne, perfume, aftershave, or lotions (deodorant is allowed). Please arrive 15 minutes prior to your appointment time.    Follow-Up: At [facility], you and your health needs are our priority.  As part of our continuing mission to provide you with exceptional heart care, we have created designated Provider Care Teams.  These Care Teams include your primary Cardiologist (physician) and Advanced Practice Providers (APPs -  Physician Assistants and Nurse Practitioners) who all work together to provide you with the care you need, when you need it.  We recommend signing up for the patient portal called "MyChart".  Sign up information is provided on this After Visit Summary.  MyChart is used to connect with patients for Virtual Visits (Telemedicine).  Patients are able to view lab/test results, encounter notes, upcoming appointments, etc.  Non-urgent messages can be sent to  your provider as well.   To learn more about what you can do with MyChart, go to [email or web address].    Your next appointment:   1 year(s)  Provider:   Christell Constant, MD

## 2022-10-02 ENCOUNTER — Ambulatory Visit (HOSPITAL_COMMUNITY): Payer: PPO | Attending: Internal Medicine

## 2022-10-02 DIAGNOSIS — I10 Essential (primary) hypertension: Secondary | ICD-10-CM | POA: Diagnosis present

## 2022-10-02 DIAGNOSIS — R011 Cardiac murmur, unspecified: Secondary | ICD-10-CM | POA: Insufficient documentation

## 2022-10-02 DIAGNOSIS — G4733 Obstructive sleep apnea (adult) (pediatric): Secondary | ICD-10-CM | POA: Insufficient documentation

## 2022-10-02 LAB — ECHOCARDIOGRAM COMPLETE
Area-P 1/2: 3.08 cm2
S' Lateral: 3.3 cm

## 2022-10-03 LAB — LAB REPORT - SCANNED
A1c: 6.6
EGFR: 59

## 2022-12-06 ENCOUNTER — Other Ambulatory Visit: Payer: Self-pay | Admitting: Internal Medicine

## 2023-12-11 ENCOUNTER — Other Ambulatory Visit: Payer: Self-pay | Admitting: Internal Medicine

## 2024-01-30 ENCOUNTER — Other Ambulatory Visit: Payer: Self-pay | Admitting: Internal Medicine

## 2024-02-01 ENCOUNTER — Other Ambulatory Visit: Payer: Self-pay | Admitting: Internal Medicine

## 2024-02-14 ENCOUNTER — Encounter: Payer: Self-pay | Admitting: Skilled Nursing Facility1

## 2024-02-14 ENCOUNTER — Encounter: Attending: Internal Medicine | Admitting: Skilled Nursing Facility1

## 2024-02-14 DIAGNOSIS — E119 Type 2 diabetes mellitus without complications: Secondary | ICD-10-CM | POA: Insufficient documentation

## 2024-02-14 NOTE — Progress Notes (Unsigned)
 Pt states he has had DM for many years.   Labs: Cholesterol 218 Triglycerides 234 LDL 133 Creatinine 1.41 A1C 9.6  Pt states he does have a CGM  DM medications: Insulin 32 units taking around 7-8pm Humolog 5-15 units per sliding scale 2 times a day (typically taking once a day about 10 units taking 1 hour before eating)  Pt is IT TRAINER and works full time.  Pt states he does not know why his blood sugars are so high in the morning. Pt states he is in the habit of using his humolog to correct insulin actually stating he takes it every morning before leaving for the day. Blood sugar 325 taking 10 units dropping to 69.   Pt states he has a girlfriend that makes sure he does right.   CGM: 14 days  24% TIR 33% 180-250 43% over 250  Average 232  GMI 8.9  Goals:  Take your Humalog 15 minutes before your meal: ensuring that meal has carbohydrate in it Drink 4 bottles of water daily Try the sugar free hazelnut creamer Can the kitchen avoid using butter  24 hr recall: eating out all meals Breakfast: grits, eggs, bacon, sometimes biscuit Snack: Lunch: [employer] Snack: Dinner: leftovers from lunch  Beverages: coffee + creamer, 2 bottles of water    Diabetes Self-Management Education  Visit Type: First/Initial  Appt. Start Time: 7:38  Appt. End Time: 8:20  02/15/2024  [name] [name], identified by name and date of birth, is a 74 y.o. male with a diagnosis of Diabetes: (Patient-Rptd) (P) Type 2.   ASSESSMENT  There were no vitals taken for this visit. There is no height or weight on file to calculate BMI.   Diabetes Self-Management Education - 02/15/24 1446       Visit Information   Visit Type First/Initial          Individualized Plan for Diabetes Self-Management Training:   Learning Objective:  Patient will have a greater understanding of diabetes self-management. Patient education plan is to attend individual and/or group sessions per assessed needs and  concerns.    Expected Outcomes:  Demonstrated interest in learning. Expect positive outcomes  Education material provided: ADA - How to Thrive: A Guide for Your Journey with Diabetes, Meal plan card, My Plate, and Snack sheet  If problems or questions, patient to contact team via:  Phone and Email  Future DSME appointment: 4-6 wks

## 2024-03-02 ENCOUNTER — Other Ambulatory Visit: Payer: Self-pay | Admitting: Internal Medicine

## 2024-03-20 ENCOUNTER — Encounter: Admitting: Skilled Nursing Facility1

## 2024-05-05 ENCOUNTER — Ambulatory Visit: Admitting: Internal Medicine
